# Patient Record
Sex: Male | Born: 1985 | Race: Black or African American | Hispanic: No | Marital: Single | Smoking: Current every day smoker
Health system: Southern US, Community
[De-identification: ages and names within clinical notes are randomized; demographics above are authoritative.]

## PROBLEM LIST (undated history)

## (undated) DIAGNOSIS — Q8781 Alport syndrome: Secondary | ICD-10-CM

## (undated) DIAGNOSIS — N289 Disorder of kidney and ureter, unspecified: Secondary | ICD-10-CM

## (undated) DIAGNOSIS — E876 Hypokalemia: Secondary | ICD-10-CM

---
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
## 2004-02-10 ENCOUNTER — Encounter: Admission: RE | Admit: 2004-02-10 | Discharge: 2004-02-10 | Payer: Self-pay | Admitting: Family Medicine

## 2004-03-06 ENCOUNTER — Encounter: Admission: RE | Admit: 2004-03-06 | Discharge: 2004-03-06 | Payer: Self-pay | Admitting: Family Medicine

## 2004-10-07 ENCOUNTER — Ambulatory Visit: Payer: Self-pay | Admitting: Family Medicine

## 2004-10-13 ENCOUNTER — Ambulatory Visit: Payer: Self-pay | Admitting: Family Medicine

## 2004-10-14 ENCOUNTER — Ambulatory Visit: Payer: Self-pay | Admitting: Family Medicine

## 2006-05-13 ENCOUNTER — Ambulatory Visit (HOSPITAL_COMMUNITY): Admission: RE | Admit: 2006-05-13 | Discharge: 2006-05-13 | Payer: Self-pay | Admitting: Nephrology

## 2006-12-13 ENCOUNTER — Telehealth: Payer: Self-pay | Admitting: *Deleted

## 2006-12-13 ENCOUNTER — Ambulatory Visit: Payer: Self-pay | Admitting: Family Medicine

## 2006-12-13 ENCOUNTER — Encounter (INDEPENDENT_AMBULATORY_CARE_PROVIDER_SITE_OTHER): Payer: Self-pay | Admitting: Family Medicine

## 2006-12-13 LAB — CONVERTED CEMR LAB
CO2: 24 meq/L (ref 19–32)
Chloride: 109 meq/L (ref 96–112)
Creatinine, Ser: 1.17 mg/dL (ref 0.40–1.50)
Glucose, Bld: 84 mg/dL (ref 70–99)

## 2007-10-30 ENCOUNTER — Telehealth: Payer: Self-pay | Admitting: *Deleted

## 2007-10-31 ENCOUNTER — Ambulatory Visit: Payer: Self-pay | Admitting: Family Medicine

## 2007-11-01 ENCOUNTER — Telehealth: Payer: Self-pay | Admitting: *Deleted

## 2007-11-01 ENCOUNTER — Encounter: Admission: RE | Admit: 2007-11-01 | Discharge: 2007-11-01 | Payer: Self-pay | Admitting: *Deleted

## 2007-11-02 ENCOUNTER — Telehealth (INDEPENDENT_AMBULATORY_CARE_PROVIDER_SITE_OTHER): Payer: Self-pay | Admitting: *Deleted

## 2008-05-10 ENCOUNTER — Telehealth: Payer: Self-pay | Admitting: *Deleted

## 2008-05-10 ENCOUNTER — Ambulatory Visit: Payer: Self-pay | Admitting: Family Medicine

## 2008-05-10 ENCOUNTER — Encounter: Payer: Self-pay | Admitting: Family Medicine

## 2008-05-20 ENCOUNTER — Encounter: Payer: Self-pay | Admitting: Family Medicine

## 2008-05-20 ENCOUNTER — Telehealth (INDEPENDENT_AMBULATORY_CARE_PROVIDER_SITE_OTHER): Payer: Self-pay | Admitting: *Deleted

## 2008-05-20 ENCOUNTER — Encounter (INDEPENDENT_AMBULATORY_CARE_PROVIDER_SITE_OTHER): Payer: Self-pay | Admitting: *Deleted

## 2009-06-13 ENCOUNTER — Encounter: Payer: Self-pay | Admitting: Family Medicine

## 2009-06-13 ENCOUNTER — Ambulatory Visit: Payer: Self-pay | Admitting: Family Medicine

## 2009-06-13 LAB — CONVERTED CEMR LAB: GC Probe Amp, Genital: NEGATIVE

## 2009-06-14 ENCOUNTER — Encounter (INDEPENDENT_AMBULATORY_CARE_PROVIDER_SITE_OTHER): Payer: Self-pay | Admitting: *Deleted

## 2009-06-14 DIAGNOSIS — F17200 Nicotine dependence, unspecified, uncomplicated: Secondary | ICD-10-CM | POA: Insufficient documentation

## 2009-06-16 ENCOUNTER — Encounter: Payer: Self-pay | Admitting: Family Medicine

## 2009-08-27 ENCOUNTER — Ambulatory Visit: Payer: Self-pay | Admitting: Family Medicine

## 2009-08-27 ENCOUNTER — Encounter: Payer: Self-pay | Admitting: Family Medicine

## 2010-05-01 LAB — CONVERTED CEMR LAB
Albumin: 4.9 g/dL (ref 3.5–5.2)
Alkaline Phosphatase: 55 units/L (ref 39–117)
BUN: 10 mg/dL (ref 6–23)
Calcium: 9.6 mg/dL (ref 8.4–10.5)
Creatinine, Ser: 0.92 mg/dL (ref 0.40–1.50)
Glucose, Bld: 78 mg/dL (ref 70–99)
Potassium: 3.7 meq/L (ref 3.5–5.3)

## 2010-07-20 ENCOUNTER — Ambulatory Visit: Payer: Self-pay

## 2010-07-21 ENCOUNTER — Ambulatory Visit: Payer: Self-pay | Admitting: Family Medicine

## 2010-07-21 DIAGNOSIS — J069 Acute upper respiratory infection, unspecified: Secondary | ICD-10-CM

## 2010-07-21 HISTORY — DX: Acute upper respiratory infection, unspecified: J06.9

## 2010-09-01 NOTE — Assessment & Plan Note (Signed)
Summary: flu symptoms,tcb   Vital Signs:  Patient profile:   25 year old male Weight:      141 pounds Temp:     98.9 degrees F oral Pulse rate:   69 / minute BP sitting:   120 / 80  (left arm) Cuff size:   regular  Vitals Entered By: Tessie Fass, CMA CC: cough and congestion Is Patient Diabetic? No Pain Assessment Patient in pain? yes     Location: head Intensity: 7   Primary Care Provider:  Delbert Harness MD  CC:  cough and congestion.  History of Present Illness: Mr. Heffern comes in for URI symptoms for 4 days.  C/O cnogestion, headache, body ache, cough.  Coughed so much yesterday had post-tussive emesis.  Yesterday coughed up yellow mucus but cough is non-productive today.  White nasal discharge.  Using nyquil and motrin for symptomatic relief. Helpign little.  SUbjective fevers and chills but did not take temperature.   Habits & Providers  Alcohol-Tobacco-Diet     Tobacco Status: current     Other Tobacco cigar     Other per week 2  Physical Exam  General:  mildly ill-appearing but in NAD Head:  very TTP over right maxillary sinus Eyes:  conjunctiva clear and moist, no inejction Ears:  bilateral TMs normal Nose:  mucosa edematous and pale with clear rhinorrhea Mouth:  oropharynx with postnasl drip Lungs:  clear to ausc bilaterally, normal wob Heart:  rRR without murmur Cervical Nodes:  No lymphadenopathy noted   Impression & Recommendations:  Problem # 1:  SINUSITIS, ACUTE (ICD-461.9) Assessment New  4 days of symptoms consistent with acute sinusitis.  LIkely still viral but did give Rx for antibiotic with explanation not to start unless not improving by the weekend.  Benzonatate and tussionex for cough.  REc 800 mg motrin three times a day as needed.  Advised rest and fluids.  The following medications were removed from the medication list:    Rondex-dm 12.12-03-13 Mg/51ml Syrp (Phenylephrine-chlorphen-dm) .Marland Kitchen... 2 teaspoons every 4-6 hours as needed , 150  ml His updated medication list for this problem includes:    Amoxicillin 875 Mg Tabs (Amoxicillin) .Marland Kitchen... 1 tab by mouth three times a day for sinus infection for 10 days    Benzonatate 100 Mg Caps (Benzonatate) .Marland Kitchen... 1 cap q 4 hrs as needed cough    Tussionex Pennkinetic Er 8-10 Mg/8ml Lqcr (Chlorpheniramine-hydrocodone) .Marland KitchenMarland KitchenMarland KitchenMarland Kitchen 5ml by mouth q hs as needed cough disp 50 ml  Orders: [facility]- Est  Level 4 (99214)  Complete Medication List: 1)  Amoxicillin 875 Mg Tabs (Amoxicillin) .Marland Kitchen.. 1 tab by mouth three times a day for sinus infection for 10 days 2)  Benzonatate 100 Mg Caps (Benzonatate) .Marland Kitchen.. 1 cap q 4 hrs as needed cough 3)  Tussionex Pennkinetic Er 8-10 Mg/65ml Lqcr (Chlorpheniramine-hydrocodone) .... 5ml by mouth q hs as needed cough disp 50 ml  Patient Instructions: 1)  You have a sinus infection.  THese can be caused by both viruses and bacteria.  It is usually a virus.  I have given you an antibiotic though.  If you aren't starting to feel better by the weekend or you develop a fever over 101, please go ahead and start the antibiotic.  If you start it, be sure to finish the entire course. 2)  You can use the benzonatate for cough during the day and the tussionex for cough during the night. 3)  It can take up to 2, sometimes even 3, weeks  for all teh symptoms to go away, but you should start to feel better and better after about a week.  Prescriptions: BENZONATATE 100 MG CAPS (BENZONATATE) 1 cap q 4 hrs as needed cough  #30 x 1   Entered and Authorized by:   Ardeen Garland  MD   Signed by:   Ardeen Garland  MD on 08/27/2009   Method used:   Print then Give to Patient   RxID:   [id number] AMOXICILLIN 875 MG TABS (AMOXICILLIN) 1 tab by mouth three times a day for sinus infection for 10 days  #30 x 0   Entered and Authorized by:   Ardeen Garland  MD   Signed by:   Ardeen Garland  MD on 08/27/2009   Method used:   Print then Give to Patient   RxID:   [id number] TUSSIONEX PENNKINETIC ER  8-10 MG/5ML LQCR (CHLORPHENIRAMINE-HYDROCODONE) 5mL by mouth q HS as needed cough disp 50 mL  #1 x 0   Entered and Authorized by:   Ardeen Garland  MD   Signed by:   Ardeen Garland  MD on 08/27/2009   Method used:   Print then Give to Patient   RxID:   [id number] BENZONATATE 100 MG CAPS (BENZONATATE) 1 cap q 4 hrs as needed cough  #30 x 1   Entered and Authorized by:   Ardeen Garland  MD   Signed by:   Ardeen Garland  MD on 08/27/2009   Method used:   Electronically to        [facility] [phone]* (retail)       [address]       [place], [place]  [address]       Ph: [phone]       Fax: [phone]   RxID:   [id number] AMOXICILLIN 875 MG TABS (AMOXICILLIN) 1 tab by mouth three times a day for sinus infection for 10 days  #30 x 0   Entered and Authorized by:   Ardeen Garland  MD   Signed by:   Ardeen Garland  MD on 08/27/2009   Method used:   Electronically to        [facility] [phone]* (retail)       [address]       [place], [place]  [address]       Ph: [phone]       Fax: [phone]   RxID:   [id number]

## 2010-09-01 NOTE — Letter (Signed)
Summary: Work Excuse  [facility]  [address]   [place], [place] [address]   Phone: [phone]  Fax: [phone]    Today's Date: August 27, 2009  Name of Patient: Cory Tucker  The above named patient had a medical visit today at:  11am .  Please take this into consideration when reviewing the time away from work/school.    Special Instructions:  [  ] None  [  ] To be off the remainder of today, returning to the normal work / school schedule tomorrow.  [  ] To be off until the next scheduled appointment on ______________________.  [ x ] Other  Please excuse [name] from work for Wednesday January 26th through Friday January 28th.  He can return to work as normal Monday January 31st.    Sincerely yours,   Ardeen Garland  MD

## 2010-09-03 NOTE — Assessment & Plan Note (Signed)
Summary: URI,df   Vital Signs:  Patient profile:   25 year old male Weight:      136.9 pounds Temp:     98.2 degrees F oral Pulse rate:   73 / minute BP sitting:   104 / 65  (left arm) Cuff size:   regular  Vitals Entered By: Jimmy Footman, CMA (July 21, 2010 11:28 AM) CC: cold sxs x1 month Is Patient Diabetic? No Pain Assessment Patient in pain? no        Primary Care Provider:  Delbert Harness MD  CC:  cold sxs x1 month.  History of Present Illness: 25 yo with one month of cough, fatigue.  Yellow sputum.  + headache, subjectively hot.   + sore throat.  No n/v/d, abd pain.  + rhinorrhea and sneezing.  + postansal drainage.  Overall getting better but cough remains.  Dayquil not helpful.  Snokes 2 black and milds a day for 2 years.  Habits & Providers  Alcohol-Tobacco-Diet     Tobacco Status: current  Current Medications (verified): 1)  Benzonatate 100 Mg Caps (Benzonatate) .Marland Kitchen.. 1 Cap Q 4 Hrs As Needed Cough  Social History: works at [employer] and [employer], finished Careers information officer, lives with mom  Review of Systems      See HPI  Physical Exam  General:  tired appearing, NAD Head:  Normocephalic and atraumatic without obvious abnormalities. No apparent alopecia or balding. Eyes:  No corneal or conjunctival inflammation noted. EOMI. Perrla. Ears:  External ear exam shows no significant lesions or deformities.  Otoscopic examination reveals clear canals, tympanic membranes are intact bilaterally without bulging, retraction, inflammation or discharge. Hearing is grossly normal bilaterally. Nose:  External nasal examination shows no deformity or inflammation. Nasal mucosa are pink and moist without lesions or exudates. Mouth:  Oral mucosa and oropharynx without lesions or exudates.  Teeth in good repair.  Postnasal drainage Neck:  shotty ant cervical LAD Lungs:  Normal respiratory effort, chest expands symmetrically. Lungs are clear to auscultation, no crackles or  wheezes. Heart:  Normal rate and regular rhythm. S1 and S2 normal without gallop, murmur, click, rub or other extra sounds. Abdomen:  Bowel sounds positive,abdomen soft and non-tender without masses, organomegaly or hernias noted.   Impression & Recommendations:  Problem # 1:  URI (ICD-465.9)  Residual cough from URI.  Lung exam clear, has sinusitis last year, no signs of sinusitis or other bacterial infection today.  Advised Antihistamine with decongestant and to return if does not continue to improve or starts having fevers.  Discussed toabcco cessation in setting of URI and respiratory irritant.  The following medications were removed from the medication list:    Tussionex Pennkinetic Er 8-10 Mg/71ml Lqcr (Chlorpheniramine-hydrocodone) .Marland KitchenMarland KitchenMarland KitchenMarland Kitchen 5ml by mouth q hs as needed cough disp 50 ml His updated medication list for this problem includes:    Benzonatate 100 Mg Caps (Benzonatate) .Marland Kitchen... 1 cap q 4 hrs as needed cough  Orders: [facility]- Est Level  3 (16109)  Problem # 2:  TOBACCO USER (ICD-305.1) discussed quitting.  Smoking 2 black and milds daily, restarted smoking 2 years ago.  Orders: [facility]- Est Level  3 (99213)  Complete Medication List: 1)  Benzonatate 100 Mg Caps (Benzonatate) .Marland Kitchen.. 1 cap q 4 hrs as needed cough  Patient Instructions: 1)  You can use the benzonatate if it helps for cough 2)  Try and antihistamine with a decongestant- for example- Claritin D, Allegra-D. 3)  It is not uncommon for have a cough for 4-6  weeks after you are getting better from a cold 4)  Follow-up in 2-3 weeks if you continue to have cough, or fevers.   Orders Added: 1)  [facility]- Est Level  3 [16109]

## 2010-09-14 ENCOUNTER — Encounter: Payer: Self-pay | Admitting: *Deleted

## 2011-02-10 ENCOUNTER — Emergency Department (HOSPITAL_COMMUNITY)
Admission: EM | Admit: 2011-02-10 | Discharge: 2011-02-11 | Disposition: A | Discharge status: Home / Self Care | Payer: BC Managed Care – PPO | Attending: Emergency Medicine | Admitting: Emergency Medicine

## 2011-02-10 DIAGNOSIS — IMO0001 Reserved for inherently not codable concepts without codable children: Secondary | ICD-10-CM | POA: Insufficient documentation

## 2011-02-10 DIAGNOSIS — R059 Cough, unspecified: Secondary | ICD-10-CM | POA: Insufficient documentation

## 2011-02-10 DIAGNOSIS — R05 Cough: Secondary | ICD-10-CM | POA: Insufficient documentation

## 2011-02-10 DIAGNOSIS — R509 Fever, unspecified: Secondary | ICD-10-CM | POA: Insufficient documentation

## 2011-02-10 DIAGNOSIS — B9789 Other viral agents as the cause of diseases classified elsewhere: Secondary | ICD-10-CM | POA: Insufficient documentation

## 2011-02-11 ENCOUNTER — Emergency Department (HOSPITAL_COMMUNITY): Payer: BC Managed Care – PPO

## 2011-02-11 LAB — URINALYSIS, ROUTINE W REFLEX MICROSCOPIC
Ketones, ur: NEGATIVE mg/dL
Nitrite: NEGATIVE
Protein, ur: NEGATIVE mg/dL
pH: 7 (ref 5.0–8.0)

## 2015-12-21 ENCOUNTER — Emergency Department (HOSPITAL_COMMUNITY)
Admission: EM | Admit: 2015-12-21 | Discharge: 2015-12-22 | Disposition: A | Discharge status: Home / Self Care | Payer: BLUE CROSS/BLUE SHIELD | Attending: Emergency Medicine | Admitting: Emergency Medicine

## 2015-12-21 ENCOUNTER — Emergency Department (HOSPITAL_COMMUNITY): Payer: BLUE CROSS/BLUE SHIELD

## 2015-12-21 ENCOUNTER — Encounter (HOSPITAL_COMMUNITY): Payer: Self-pay

## 2015-12-21 DIAGNOSIS — S92911A Unspecified fracture of right toe(s), initial encounter for closed fracture: Secondary | ICD-10-CM

## 2015-12-21 DIAGNOSIS — Y9389 Activity, other specified: Secondary | ICD-10-CM | POA: Diagnosis not present

## 2015-12-21 DIAGNOSIS — S99921A Unspecified injury of right foot, initial encounter: Secondary | ICD-10-CM | POA: Diagnosis present

## 2015-12-21 DIAGNOSIS — Y998 Other external cause status: Secondary | ICD-10-CM | POA: Diagnosis not present

## 2015-12-21 DIAGNOSIS — Y9289 Other specified places as the place of occurrence of the external cause: Secondary | ICD-10-CM | POA: Diagnosis not present

## 2015-12-21 DIAGNOSIS — Z87448 Personal history of other diseases of urinary system: Secondary | ICD-10-CM | POA: Insufficient documentation

## 2015-12-21 DIAGNOSIS — Q8781 Alport syndrome: Secondary | ICD-10-CM | POA: Insufficient documentation

## 2015-12-21 DIAGNOSIS — F17200 Nicotine dependence, unspecified, uncomplicated: Secondary | ICD-10-CM | POA: Insufficient documentation

## 2015-12-21 DIAGNOSIS — W1849XA Other slipping, tripping and stumbling without falling, initial encounter: Secondary | ICD-10-CM | POA: Insufficient documentation

## 2015-12-21 DIAGNOSIS — S92534A Nondisplaced fracture of distal phalanx of right lesser toe(s), initial encounter for closed fracture: Secondary | ICD-10-CM | POA: Diagnosis not present

## 2015-12-21 HISTORY — DX: Disorder of kidney and ureter, unspecified: N28.9

## 2015-12-21 HISTORY — DX: Alport syndrome: Q87.81

## 2015-12-21 MED ORDER — IBUPROFEN 600 MG PO TABS: 600.0000 mg | ORAL_TABLET | Freq: Four times a day (QID) | ORAL | Status: DC | PRN

## 2015-12-21 MED ORDER — IBUPROFEN 400 MG PO TABS
600.0000 mg | ORAL_TABLET | Freq: Once | ORAL | Status: AC
  Administered 2015-12-21: 600 mg via ORAL
  Filled 2015-12-21: qty 1

## 2015-12-21 NOTE — ED Provider Notes (Signed)
CSN: [id number]     Arrival date & time 12/21/15  2153 History   First MD Initiated Contact with Patient 12/21/15 2236     Chief Complaint  Patient presents with  . Toe Pain     (Consider location/radiation/quality/duration/timing/severity/associated sxs/prior Treatment) HPI Comments: Vision states he tripped over his child's collapsible chair and folded upon his right fifth toe yesterday.  Since that time.  It's been bruised and swollen.  Has not taken any medication for discomfort, nor tried any home remedies.  Patient is a 30 y.o. male presenting with toe pain. The history is provided by the patient.  Toe Pain This is a new problem. The current episode started yesterday. The problem occurs constantly. The problem has been gradually worsening. Associated symptoms include joint swelling. Pertinent negatives include no chills or fever. The symptoms are aggravated by walking. He has tried nothing for the symptoms. The treatment provided no relief.    Past Medical History  Diagnosis Date  . Renal disorder   . Alport syndrome    History reviewed. No pertinent past surgical history. No family history on file. Social History  Substance Use Topics  . Smoking status: Current Every Day Smoker  . Smokeless tobacco: None  . Alcohol Use: No    Review of Systems  Constitutional: Negative for fever and chills.  Respiratory: Negative for shortness of breath.   Musculoskeletal: Positive for joint swelling.  Skin: Negative for wound.  All other systems reviewed and are negative.     Allergies  Review of patient's allergies indicates not on file.  Home Medications   Prior to Admission medications   Medication Sig Start Date End Date Taking? Authorizing Provider  benzonatate (TESSALON) 100 MG capsule Take 100 mg by mouth 4 (four) times daily as needed. For cough     Historical Provider, MD  ibuprofen (ADVIL,MOTRIN) 600 MG tablet Take 1 tablet (600 mg total) by mouth every 6 (six) hours  as needed. 12/21/15   Earley FavorGail Brandonlee Navis, NP   BP 109/69 mmHg  Pulse 65  Temp(Src) 98.5 F (36.9 C) (Oral)  Resp 16  SpO2 100% Physical Exam  Constitutional: He appears well-developed and well-nourished.  HENT:  Head: Normocephalic.  Eyes: Pupils are equal, round, and reactive to light.  Neck: Normal range of motion.  Cardiovascular: Normal rate.   Pulmonary/Chest: Effort normal.  Musculoskeletal: He exhibits edema and tenderness.       Feet:  Neurological: He is alert.  Skin: Skin is warm.  Nursing note and vitals reviewed.   ED Course  Procedures (including critical care time) Labs Review Labs Reviewed - No data to display  Imaging Review Dg Foot Complete Right  12/21/2015  CLINICAL DATA:  Initial evaluation for acute traumatic injury. Pain at right fifth toe. EXAM: RIGHT FOOT COMPLETE - 3+ VIEW COMPARISON:  None available. FINDINGS: There is a subtle linear lucency through the distal phalanx of the right fifth digit, suspicious for an acute nondisplaced fracture. Mild soft tissue swelling. Associated intra-articular extension present. The right fifth DIP joint remains approximated. No other acute fracture. Osseous mineralization normal. Soft tissues otherwise unremarkable. No radiopaque foreign body. IMPRESSION: Acute nondisplaced fracture through the right fifth distal phalanx with intra-articular extension. Electronically Signed   By: Rise MuBenjamin  McClintock M.D.   On: 12/21/2015 23:22   I have personally reviewed and evaluated these images and lab results as part of my medical decision-making.   EKG Interpretation None      MDM  Displaced fracture of  the distal phalanx of the right fifth toe.  Patient has been buddy taped and placed in a flat bottom shoe, given orthopedic referral if no significant improvement in his symptoms in the next 7-10 days Final diagnoses:  Toe fracture, right, closed, initial encounter         Earley Favor, NP 12/21/15 2349  Earley Favor,  NP 12/21/15 2349  Vanetta Mulders, MD 12/23/15 [phone]

## 2015-12-21 NOTE — Discharge Instructions (Signed)
You have a nondisplaced fracture in your right fifth toe should heal without any intervention.  It is been buddy taped for support.  Please uses support method for the next 2-4 weeks.  Also been placed in a flat bottom shoe to help with ambulation.  Please take ibuprofen on a regular basis for discomfort.  You've also been given a referral to orthopedic surgery for follow-up if you do do not see any significant improvement in the next 7-10 days

## 2015-12-21 NOTE — ED Notes (Signed)
Pt reports he tripped over a chair yesterday and has pain to right foot and right pinky toe. Toe appears bruised and swollen.

## 2015-12-22 NOTE — ED Notes (Signed)
Pt is in stable condition upon d/c and ambulates from ED. 

## 2016-06-10 ENCOUNTER — Encounter (HOSPITAL_COMMUNITY): Payer: Self-pay | Admitting: Emergency Medicine

## 2016-06-10 ENCOUNTER — Ambulatory Visit (HOSPITAL_COMMUNITY)
Admission: EM | Admit: 2016-06-10 | Discharge: 2016-06-10 | Disposition: A | Discharge status: Home / Self Care | Payer: BLUE CROSS/BLUE SHIELD | Attending: Emergency Medicine | Admitting: Emergency Medicine

## 2016-06-10 DIAGNOSIS — Z202 Contact with and (suspected) exposure to infections with a predominantly sexual mode of transmission: Secondary | ICD-10-CM | POA: Diagnosis not present

## 2016-06-10 DIAGNOSIS — F17210 Nicotine dependence, cigarettes, uncomplicated: Secondary | ICD-10-CM | POA: Diagnosis not present

## 2016-06-10 MED ORDER — METRONIDAZOLE 500 MG PO TABS: 500.0000 mg | ORAL_TABLET | Freq: Two times a day (BID) | ORAL | 0 refills | Status: DC

## 2016-06-10 MED ORDER — CEFTRIAXONE SODIUM 250 MG IJ SOLR
250.0000 mg | Freq: Once | INTRAMUSCULAR | Status: AC
  Administered 2016-06-10: 250 mg via INTRAMUSCULAR

## 2016-06-10 MED ORDER — AZITHROMYCIN 250 MG PO TABS
ORAL_TABLET | ORAL | Status: AC
  Filled 2016-06-10: qty 4

## 2016-06-10 MED ORDER — AZITHROMYCIN 250 MG PO TABS
1000.0000 mg | ORAL_TABLET | Freq: Once | ORAL | Status: AC
  Administered 2016-06-10: 1000 mg via ORAL

## 2016-06-10 MED ORDER — CEFTRIAXONE SODIUM 250 MG IJ SOLR
INTRAMUSCULAR | Status: AC
  Filled 2016-06-10: qty 250

## 2016-06-10 MED ORDER — LIDOCAINE HCL (PF) 1 % IJ SOLN
INTRAMUSCULAR | Status: AC
  Filled 2016-06-10: qty 2

## 2016-06-10 NOTE — Discharge Instructions (Signed)
We went ahead and treated you for STDs today. Take the Flagyl twice a day for 7 days. Do not drink alcohol while taking this medicine. We will call you with your results in 2-3 days. Follow-up as needed.

## 2016-06-10 NOTE — ED Notes (Signed)
Pt reports he voided urine 5 min ago prior to being brought back to room.

## 2016-06-10 NOTE — ED Provider Notes (Signed)
[facility]    CSN: [id number] Arrival date & time: 06/10/16  1419     History   Chief Complaint Chief Complaint  Patient presents with  . Exposure to STD    HPI Cory Tucker is a 30 y.o. male.   HPI  He is a 30 year old man here for exposure to STD. His girlfriend recently tested positive for chlamydia and trichomonas. He denies any symptoms.  Past Medical History:  Diagnosis Date  . Alport syndrome   . Alport syndrome   . Renal disorder     Patient Active Problem List   Diagnosis Date Noted  . URI 07/21/2010  . TOBACCO USER 06/14/2009    History reviewed. No pertinent surgical history.     Home Medications    Prior to Admission medications   Medication Sig Start Date End Date Taking? Authorizing Provider  metroNIDAZOLE (FLAGYL) 500 MG tablet Take 1 tablet (500 mg total) by mouth 2 (two) times daily. 06/10/16   Charm RingsErin J Zakhi Dupre, MD    Family History No family history on file.  Social History Social History  Substance Use Topics  . Smoking status: Current Every Day Smoker    Packs/day: 0.00    Types: Cigars  . Smokeless tobacco: Never Used  . Alcohol use No     Allergies   Patient has no known allergies.   Review of Systems Review of Systems As in history of present illness  Physical Exam Triage Vital Signs ED Triage Vitals [06/10/16 1431]  Enc Vitals Group     BP 105/61     Pulse Rate 67     Resp 18     Temp 98.3 F (36.8 C)     Temp Source Oral     SpO2 98 %     Weight      Height      Head Circumference      Peak Flow      Pain Score      Pain Loc      Pain Edu?      Excl. in GC?    No data found.   Updated Vital Signs BP 105/61 (BP Location: Left Arm)   Pulse 67   Temp 98.3 F (36.8 C) (Oral)   Resp 18   SpO2 98%   Visual Acuity Right Eye Distance:   Left Eye Distance:   Bilateral Distance:    Right Eye Near:   Left Eye Near:    Bilateral Near:     Physical Exam   UC Treatments / Results    Labs (all labs ordered are listed, but only abnormal results are displayed) Labs Reviewed  CYTOLOGY, (ORAL, ANAL, URETHRAL) ANCILLARY ONLY    EKG  EKG Interpretation None       Radiology No results found.  Procedures Procedures (including critical care time)  Medications Ordered in UC Medications  azithromycin (ZITHROMAX) tablet 1,000 mg (1,000 mg Oral Given 06/10/16 1503)  cefTRIAXone (ROCEPHIN) injection 250 mg (250 mg Intramuscular Given 06/10/16 1503)     Initial Impression / Assessment and Plan / UC Course  I have reviewed the triage vital signs and the nursing notes.  Pertinent labs & imaging results that were available during my care of the patient were reviewed by me and considered in my medical decision making (see chart for details).  Clinical Course     Presumptively treated with Rocephin and azithromycin here. Prescription for Flagyl given. Follow-up as needed.  Final Clinical Impressions(s) /  UC Diagnoses   Final diagnoses:  STD exposure    New Prescriptions New Prescriptions   METRONIDAZOLE (FLAGYL) 500 MG TABLET    Take 1 tablet (500 mg total) by mouth 2 (two) times daily.     Charm RingsErin J Lea Walbert, MD 06/10/16 [phone]

## 2016-06-10 NOTE — ED Triage Notes (Signed)
Here for STD check  Reports partner went to her prenatal care and was treated for Trich and Chlamydia  Pt is asymptomatic  Sexually active in a monogamous relationship w/no condom use.   A&O x4... NAD

## 2016-06-11 LAB — CYTOLOGY, (ORAL, ANAL, URETHRAL) ANCILLARY ONLY
CHLAMYDIA, DNA PROBE: POSITIVE — AB
Neisseria Gonorrhea: NEGATIVE
TRICH (WINDOWPATH): NEGATIVE

## 2016-06-12 ENCOUNTER — Telehealth (HOSPITAL_COMMUNITY): Payer: Self-pay | Admitting: Emergency Medicine

## 2016-06-12 NOTE — Telephone Encounter (Signed)
-----   Message from Charm RingsErin J Honig, MD sent at 06/11/2016  5:07 PM EST ----- Testing is positive for Chlamydia.  [facility]  Okay to stop the Flagyl as trichomonas test is negative.  Follow up as needed. Note copied to MyChart. EH

## 2016-06-12 NOTE — Telephone Encounter (Signed)
Called pt and notified of recent lab results Pt ID'd properly... Reports feeling better and sx have subsided Adv him to stop Flagyl Adv pt if sx are not getting better to return or to f/u w/PCP Education on safe sex given Also adv pt to notify partner(s) [facility] verb understanding.

## 2016-08-11 ENCOUNTER — Emergency Department (HOSPITAL_COMMUNITY): Payer: BLUE CROSS/BLUE SHIELD

## 2016-08-11 ENCOUNTER — Encounter (HOSPITAL_COMMUNITY): Payer: Self-pay | Admitting: *Deleted

## 2016-08-11 ENCOUNTER — Emergency Department (HOSPITAL_COMMUNITY)
Admission: EM | Admit: 2016-08-11 | Discharge: 2016-08-11 | Disposition: A | Discharge status: Home / Self Care | Payer: BLUE CROSS/BLUE SHIELD | Attending: Emergency Medicine | Admitting: Emergency Medicine

## 2016-08-11 DIAGNOSIS — Z79899 Other long term (current) drug therapy: Secondary | ICD-10-CM | POA: Insufficient documentation

## 2016-08-11 DIAGNOSIS — R1084 Generalized abdominal pain: Secondary | ICD-10-CM | POA: Diagnosis not present

## 2016-08-11 DIAGNOSIS — F17290 Nicotine dependence, other tobacco product, uncomplicated: Secondary | ICD-10-CM | POA: Insufficient documentation

## 2016-08-11 DIAGNOSIS — R112 Nausea with vomiting, unspecified: Secondary | ICD-10-CM | POA: Insufficient documentation

## 2016-08-11 DIAGNOSIS — R197 Diarrhea, unspecified: Secondary | ICD-10-CM

## 2016-08-11 DIAGNOSIS — R109 Unspecified abdominal pain: Secondary | ICD-10-CM

## 2016-08-11 LAB — URINALYSIS, ROUTINE W REFLEX MICROSCOPIC
Bacteria, UA: NONE SEEN
Bilirubin Urine: NEGATIVE
Glucose, UA: NEGATIVE mg/dL
Ketones, ur: 5 mg/dL — AB
Leukocytes, UA: NEGATIVE
Nitrite: NEGATIVE
Protein, ur: 100 mg/dL — AB
Specific Gravity, Urine: 1.033 — ABNORMAL HIGH (ref 1.005–1.030)
pH: 5 (ref 5.0–8.0)

## 2016-08-11 LAB — CBC
HCT: 41.6 % (ref 39.0–52.0)
Hemoglobin: 13.8 g/dL (ref 13.0–17.0)
MCH: 28.2 pg (ref 26.0–34.0)
MCHC: 33.2 g/dL (ref 30.0–36.0)
MCV: 84.9 fL (ref 78.0–100.0)
Platelets: 120 10*3/uL — ABNORMAL LOW (ref 150–400)
RBC: 4.9 MIL/uL (ref 4.22–5.81)
RDW: 13.4 % (ref 11.5–15.5)
WBC: 4 10*3/uL (ref 4.0–10.5)

## 2016-08-11 LAB — COMPREHENSIVE METABOLIC PANEL
ALT: 21 U/L (ref 17–63)
AST: 31 U/L (ref 15–41)
Albumin: 4.2 g/dL (ref 3.5–5.0)
Alkaline Phosphatase: 44 U/L (ref 38–126)
Anion gap: 9 (ref 5–15)
BUN: 10 mg/dL (ref 6–20)
CO2: 24 mmol/L (ref 22–32)
Calcium: 9 mg/dL (ref 8.9–10.3)
Chloride: 102 mmol/L (ref 101–111)
Creatinine, Ser: 1.04 mg/dL (ref 0.61–1.24)
GFR calc Af Amer: >60 mL/min (ref >60)
GFR calc non Af Amer: >60 mL/min (ref >60)
Glucose, Bld: 101 mg/dL — ABNORMAL HIGH (ref 65–99)
Potassium: 3.6 mmol/L (ref 3.5–5.1)
Sodium: 135 mmol/L (ref 135–145)
Total Bilirubin: 0.8 mg/dL (ref 0.3–1.2)
Total Protein: 6.9 g/dL (ref 6.5–8.1)

## 2016-08-11 LAB — LIPASE, BLOOD: Lipase: 20 U/L (ref 11–51)

## 2016-08-11 MED ORDER — KETOROLAC TROMETHAMINE 15 MG/ML IJ SOLN
15.0000 mg | Freq: Once | INTRAMUSCULAR | Status: AC
  Administered 2016-08-11: 15 mg via INTRAVENOUS
  Filled 2016-08-11: qty 1

## 2016-08-11 MED ORDER — ONDANSETRON HCL 4 MG/2ML IJ SOLN
4.0000 mg | Freq: Once | INTRAMUSCULAR | Status: AC
  Administered 2016-08-11: 4 mg via INTRAVENOUS
  Filled 2016-08-11: qty 2

## 2016-08-11 MED ORDER — SODIUM CHLORIDE 0.9 % IV BOLUS (SEPSIS)
1000.0000 mL | Freq: Once | INTRAVENOUS | Status: AC
  Administered 2016-08-11: 1000 mL via INTRAVENOUS

## 2016-08-11 MED ORDER — ONDANSETRON HCL 4 MG PO TABS: 4.0000 mg | ORAL_TABLET | Freq: Four times a day (QID) | ORAL | 0 refills | Status: DC

## 2016-08-11 MED ORDER — MORPHINE SULFATE (PF) 4 MG/ML IV SOLN
4.0000 mg | Freq: Once | INTRAVENOUS | Status: AC
  Administered 2016-08-11: 4 mg via INTRAVENOUS
  Filled 2016-08-11: qty 1

## 2016-08-11 NOTE — ED Triage Notes (Signed)
Reports abdominal pain , N/V/D for 2 days. Pt reports generalized body aches as well with headache.

## 2016-08-11 NOTE — ED Provider Notes (Signed)
[facility]-EMERGENCY DEPT Provider Note    By signing my name below, I, Earmon Phoenix, attest that this documentation has been prepared under the direction and in the presence of Raeford Razor, MD. Electronically Signed: Earmon Phoenix, ED Scribe. 08/11/16. 2:03 PM.   History   Chief Complaint Chief Complaint  Patient presents with  . Abdominal Pain    The history is provided by the patient and medical records. No language interpreter was used.    Cory Tucker is a 31 y.o. male who presents to the Emergency Department complaining of moderate diffuse abdominal pain that began two days ago. He reports associated cough, sore throat, generalized body aches, nonproductive cough, nausea, vomiting and diarrhea.He reports some mild SOB. Pt reports he has not been eating and drinking normally due to the symptoms. He has taken Tylenol and DayQuil for with minimal relief. Eating or drinking induces the vomiting. Coughing increases the sore throat and body aches. Pt denies alleviating factors. He denies fever, chills.   Past Medical History:  Diagnosis Date  . Alport syndrome   . Alport syndrome   . Renal disorder     Patient Active Problem List   Diagnosis Date Noted  . URI 07/21/2010  . TOBACCO USER 06/14/2009    History reviewed. No pertinent surgical history.     Home Medications    Prior to Admission medications   Medication Sig Start Date End Date Taking? Authorizing Provider  metroNIDAZOLE (FLAGYL) 500 MG tablet Take 1 tablet (500 mg total) by mouth 2 (two) times daily. 06/10/16   Charm Rings, MD    Family History No family history on file.  Social History Social History  Substance Use Topics  . Smoking status: Current Every Day Smoker    Packs/day: 0.00    Types: Cigars  . Smokeless tobacco: Never Used  . Alcohol use No     Allergies   Patient has no known allergies.   Review of Systems Review of Systems A complete 10 system review of systems was  obtained and all systems are negative except as noted in the HPI and PMH.    Physical Exam Updated Vital Signs BP 115/64 (BP Location: Right Arm)   Pulse 74   Temp 98.6 F (37 C) (Oral)   Resp 14   SpO2 100%   Physical Exam  Constitutional: He is oriented to person, place, and time. He appears well-developed and well-nourished.  Appears tired but nontoxic.  HENT:  Head: Normocephalic.  Eyes: EOM are normal.  Neck: Normal range of motion.  Cardiovascular: Regular rhythm and normal heart sounds.  Tachycardia present.  Exam reveals no gallop and no friction rub.   No murmur heard. Mild tachycardia  Pulmonary/Chest: Effort normal and breath sounds normal. No respiratory distress. He has no wheezes. He has no rales.  Abdominal: Soft. He exhibits no distension. There is tenderness (mild diffuse).  Musculoskeletal: Normal range of motion.  Neurological: He is alert and oriented to person, place, and time.  Psychiatric: He has a normal mood and affect.  Nursing note and vitals reviewed.    ED Treatments / Results  DIAGNOSTIC STUDIES: Oxygen Saturation is 100% on 2 L/min Perla, normal by my interpretation.   COORDINATION OF CARE: 11:36 AM- Will order IV fluids and nausea medication. Pt verbalizes understanding and agrees to plan.  Medications  sodium chloride 0.9 % bolus 1,000 mL (0 mLs Intravenous Stopped 08/11/16 1257)  ondansetron (ZOFRAN) injection 4 mg (4 mg Intravenous Given 08/11/16 1211)  ketorolac (  TORADOL) 15 MG/ML injection 15 mg (15 mg Intravenous Given 08/11/16 1211)  morphine 4 MG/ML injection 4 mg (4 mg Intravenous Given 08/11/16 1211)    Labs (all labs ordered are listed, but only abnormal results are displayed) Labs Reviewed  COMPREHENSIVE METABOLIC PANEL - Abnormal; Notable for the following:       Result Value   Glucose, Bld 101 (*)    All other components within normal limits  CBC - Abnormal; Notable for the following:    Platelets 120 (*)    All other  components within normal limits  URINALYSIS, ROUTINE W REFLEX MICROSCOPIC - Abnormal; Notable for the following:    Color, Urine AMBER (*)    APPearance CLOUDY (*)    Specific Gravity, Urine 1.033 (*)    Hgb urine dipstick MODERATE (*)    Ketones, ur 5 (*)    Protein, ur 100 (*)    Squamous Epithelial / LPF 0-5 (*)    Non Squamous Epithelial 0-5 (*)    All other components within normal limits  LIPASE, BLOOD    EKG  EKG Interpretation None       Radiology No results found.  Procedures Procedures (including critical care time)  Medications Ordered in ED Medications  sodium chloride 0.9 % bolus 1,000 mL (0 mLs Intravenous Stopped 08/11/16 1257)  ondansetron (ZOFRAN) injection 4 mg (4 mg Intravenous Given 08/11/16 1211)  ketorolac (TORADOL) 15 MG/ML injection 15 mg (15 mg Intravenous Given 08/11/16 1211)  morphine 4 MG/ML injection 4 mg (4 mg Intravenous Given 08/11/16 1211)     Initial Impression / Assessment and Plan / ED Course  I have reviewed the triage vital signs and the nursing notes.  Pertinent labs & imaging results that were available during my care of the patient were reviewed by me and considered in my medical decision making (see chart for details).  Clinical Course     31 year old male with abdominal pain and nausea/vomiting/diarrhea. Suspect viral GI illness. Very low suspicion for acute surgical abdominal process or other emergent condition. It has been determined that no acute conditions requiring further emergency intervention are present at this time. The patient has been advised of the diagnosis and plan. I reviewed any labs and imaging including any potential incidental findings. We have discussed signs and symptoms that warrant return to the ED and they are listed in the discharge instructions.    I personally preformed the services scribed in my presence. The recorded information has been reviewed is accurate. Raeford RazorStephen Siegfried Vieth, MD.   Final Clinical  Impressions(s) / ED Diagnoses   Final diagnoses:  Abdominal pain, unspecified abdominal location  Nausea vomiting and diarrhea    New Prescriptions New Prescriptions   No medications on file     Raeford RazorStephen Celinda Dethlefs, MD 08/23/16 1401

## 2016-08-11 NOTE — ED Notes (Signed)
Patient transported to X-ray 

## 2016-09-10 ENCOUNTER — Encounter (HOSPITAL_COMMUNITY): Payer: Self-pay

## 2016-09-10 ENCOUNTER — Emergency Department (HOSPITAL_COMMUNITY): Payer: BLUE CROSS/BLUE SHIELD

## 2016-09-10 ENCOUNTER — Emergency Department (HOSPITAL_COMMUNITY)
Admission: EM | Admit: 2016-09-10 | Discharge: 2016-09-10 | Disposition: A | Discharge status: Home / Self Care | Payer: BLUE CROSS/BLUE SHIELD | Attending: Emergency Medicine | Admitting: Emergency Medicine

## 2016-09-10 DIAGNOSIS — F17290 Nicotine dependence, other tobacco product, uncomplicated: Secondary | ICD-10-CM | POA: Diagnosis not present

## 2016-09-10 DIAGNOSIS — J181 Lobar pneumonia, unspecified organism: Secondary | ICD-10-CM | POA: Diagnosis not present

## 2016-09-10 DIAGNOSIS — Z79899 Other long term (current) drug therapy: Secondary | ICD-10-CM | POA: Diagnosis not present

## 2016-09-10 DIAGNOSIS — E876 Hypokalemia: Secondary | ICD-10-CM | POA: Diagnosis not present

## 2016-09-10 DIAGNOSIS — J189 Pneumonia, unspecified organism: Secondary | ICD-10-CM

## 2016-09-10 DIAGNOSIS — R05 Cough: Secondary | ICD-10-CM | POA: Diagnosis present

## 2016-09-10 LAB — CBC
HEMATOCRIT: 34.6 % — AB (ref 39.0–52.0)
HEMOGLOBIN: 11.8 g/dL — AB (ref 13.0–17.0)
MCH: 28.4 pg (ref 26.0–34.0)
MCHC: 34.1 g/dL (ref 30.0–36.0)
MCV: 83.2 fL (ref 78.0–100.0)
Platelets: 176 10*3/uL (ref 150–400)
RBC: 4.16 MIL/uL — ABNORMAL LOW (ref 4.22–5.81)
RDW: 13.5 % (ref 11.5–15.5)
WBC: 11 10*3/uL — AB (ref 4.0–10.5)

## 2016-09-10 LAB — COMPREHENSIVE METABOLIC PANEL
ALBUMIN: 3.8 g/dL (ref 3.5–5.0)
ALT: 18 U/L (ref 17–63)
ANION GAP: 9 (ref 5–15)
AST: 23 U/L (ref 15–41)
Alkaline Phosphatase: 45 U/L (ref 38–126)
BILIRUBIN TOTAL: 1.2 mg/dL (ref 0.3–1.2)
BUN: 11 mg/dL (ref 6–20)
CHLORIDE: 102 mmol/L (ref 101–111)
CO2: 23 mmol/L (ref 22–32)
Calcium: 8.5 mg/dL — ABNORMAL LOW (ref 8.9–10.3)
Creatinine, Ser: 1.07 mg/dL (ref 0.61–1.24)
GFR calc Af Amer: >60 mL/min (ref >60)
GLUCOSE: 94 mg/dL (ref 65–99)
POTASSIUM: 3.1 mmol/L — AB (ref 3.5–5.1)
Sodium: 134 mmol/L — ABNORMAL LOW (ref 135–145)
TOTAL PROTEIN: 7.1 g/dL (ref 6.5–8.1)

## 2016-09-10 LAB — URINALYSIS, ROUTINE W REFLEX MICROSCOPIC
BILIRUBIN URINE: NEGATIVE
Glucose, UA: NEGATIVE mg/dL
KETONES UR: 20 mg/dL — AB
NITRITE: NEGATIVE
PROTEIN: 100 mg/dL — AB
Specific Gravity, Urine: 1.029 (ref 1.005–1.030)
pH: 5 (ref 5.0–8.0)

## 2016-09-10 LAB — CK: Total CK: 95 U/L (ref 49–397)

## 2016-09-10 LAB — I-STAT CG4 LACTIC ACID, ED: LACTIC ACID, VENOUS: 0.66 mmol/L (ref 0.5–1.9)

## 2016-09-10 LAB — LIPASE, BLOOD: LIPASE: 10 U/L — AB (ref 11–51)

## 2016-09-10 MED ORDER — BENZONATATE 100 MG PO CAPS: 100.0000 mg | ORAL_CAPSULE | Freq: Three times a day (TID) | ORAL | 0 refills | Status: DC

## 2016-09-10 MED ORDER — KETOROLAC TROMETHAMINE 30 MG/ML IJ SOLN
30.0000 mg | Freq: Once | INTRAMUSCULAR | Status: AC
  Administered 2016-09-10: 30 mg via INTRAVENOUS
  Filled 2016-09-10: qty 1

## 2016-09-10 MED ORDER — ONDANSETRON HCL 4 MG/2ML IJ SOLN
4.0000 mg | Freq: Once | INTRAMUSCULAR | Status: AC
  Administered 2016-09-10: 4 mg via INTRAVENOUS
  Filled 2016-09-10: qty 2

## 2016-09-10 MED ORDER — SODIUM CHLORIDE 0.9 % IV BOLUS (SEPSIS)
1000.0000 mL | Freq: Once | INTRAVENOUS | Status: AC
  Administered 2016-09-10: 1000 mL via INTRAVENOUS

## 2016-09-10 MED ORDER — SODIUM CHLORIDE 0.9 % IV SOLN
Freq: Once | INTRAVENOUS | Status: AC
Start: 2016-09-10 — End: 2016-09-10
  Administered 2016-09-10: 18:00:00 via INTRAVENOUS

## 2016-09-10 MED ORDER — POTASSIUM CHLORIDE ER 10 MEQ PO TBCR: 10.0000 meq | EXTENDED_RELEASE_TABLET | Freq: Two times a day (BID) | ORAL | 0 refills | Status: DC

## 2016-09-10 MED ORDER — POTASSIUM CHLORIDE CRYS ER 20 MEQ PO TBCR
40.0000 meq | EXTENDED_RELEASE_TABLET | Freq: Once | ORAL | Status: AC
  Administered 2016-09-10: 40 meq via ORAL
  Filled 2016-09-10: qty 2

## 2016-09-10 MED ORDER — IBUPROFEN 200 MG PO TABS
400.0000 mg | ORAL_TABLET | Freq: Once | ORAL | Status: AC | PRN
  Administered 2016-09-10: 400 mg via ORAL
  Filled 2016-09-10: qty 2

## 2016-09-10 MED ORDER — LEVOFLOXACIN 500 MG PO TABS: 500.0000 mg | ORAL_TABLET | Freq: Every day | ORAL | 0 refills | Status: DC

## 2016-09-10 MED ORDER — MORPHINE SULFATE (PF) 4 MG/ML IV SOLN
4.0000 mg | Freq: Once | INTRAVENOUS | Status: AC
  Administered 2016-09-10: 4 mg via INTRAVENOUS
  Filled 2016-09-10: qty 1

## 2016-09-10 MED ORDER — DEXTROSE 5 % IV SOLN
1.0000 g | Freq: Once | INTRAVENOUS | Status: AC
  Administered 2016-09-10: 1 g via INTRAVENOUS
  Filled 2016-09-10: qty 10

## 2016-09-10 MED ORDER — OXYCODONE-ACETAMINOPHEN 5-325 MG PO TABS
1.0000 | ORAL_TABLET | ORAL | Status: DC | PRN
  Administered 2016-09-10: 1 via ORAL
  Filled 2016-09-10: qty 1

## 2016-09-10 NOTE — ED Provider Notes (Signed)
[facility]-EMERGENCY DEPT Provider Note   CSN: [id number] Arrival date & time: 09/10/16  1459     History   Chief Complaint Chief Complaint  Patient presents with  . Flank Pain    HPI Cory Tucker is a 31 y.o. male. Chief complaint fever and body aches  HPI:  Patient states that he had a illness "a few weeks ago" and was seen at [facility] and discharged. He had a weak feeling better and returned.  He reports symptoms starting 3 days ago on Tuesday. States that he felt weak and had to leave work early. He said fevers and body aches since then with cough. Been taking over-the-counter medications with minimal improvement. Has diffuse bodyaches now. Nausea but no vomiting. Cough. Dark urine. No diarrhea. Did not receive a flu vaccine.  History of Alport syndrome. States he used to have hematuria but has not for a long time. His urine is been dark for the last all days. No history of liver disease.   Past Medical History:  Diagnosis Date  . Alport syndrome   . Alport syndrome   . Renal disorder     Patient Active Problem List   Diagnosis Date Noted  . URI 07/21/2010  . TOBACCO USER 06/14/2009    History reviewed. No pertinent surgical history.     Home Medications    Prior to Admission medications   Medication Sig Start Date End Date Taking? Authorizing Provider  acetaminophen (TYLENOL) 325 MG tablet Take 650 mg by mouth every 6 (six) hours as needed for mild pain.   Yes Historical Provider, MD  ibuprofen (ADVIL,MOTRIN) 200 MG tablet Take 600-800 mg by mouth every 6 (six) hours as needed for moderate pain.   Yes Historical Provider, MD  Pseudoeph-Doxylamine-DM-APAP (NYQUIL PO) Take 1 capsule by mouth every 8 (eight) hours as needed (cold).   Yes Historical Provider, MD  benzonatate (TESSALON) 100 MG capsule Take 1 capsule (100 mg total) by mouth every 8 (eight) hours. 09/10/16   Rolland Porter, MD  levofloxacin (LEVAQUIN) 500 MG tablet Take 1 tablet (500 mg total) by mouth  daily. 09/10/16   Rolland Porter, MD  ondansetron (ZOFRAN) 4 MG tablet Take 1 tablet (4 mg total) by mouth every 6 (six) hours. Patient not taking: Reported on 09/10/2016 08/11/16   Raeford Razor, MD  potassium chloride (K-DUR) 10 MEQ tablet Take 1 tablet (10 mEq total) by mouth 2 (two) times daily. 09/10/16   Rolland Porter, MD    Family History History reviewed. No pertinent family history.  Social History Social History  Substance Use Topics  . Smoking status: Current Every Day Smoker    Packs/day: 0.00    Types: Cigars  . Smokeless tobacco: Never Used  . Alcohol use No     Allergies   Patient has no known allergies.   Review of Systems Review of Systems  Constitutional: Positive for appetite change, chills, diaphoresis, fatigue and fever.  HENT: Positive for sneezing and sore throat. Negative for mouth sores and trouble swallowing.   Eyes: Negative for visual disturbance.  Respiratory: Positive for cough and shortness of breath. Negative for chest tightness and wheezing.   Cardiovascular: Negative for chest pain.  Gastrointestinal: Positive for nausea. Negative for abdominal distention, abdominal pain, diarrhea and vomiting.  Endocrine: Negative for polydipsia, polyphagia and polyuria.  Genitourinary: Negative for dysuria, frequency and hematuria.  Musculoskeletal: Positive for myalgias. Negative for gait problem.  Skin: Negative for color change, pallor and rash.  Neurological: Negative for dizziness,  syncope, light-headedness and headaches.  Hematological: Does not bruise/bleed easily.  Psychiatric/Behavioral: Negative for behavioral problems and confusion.     Physical Exam Updated Vital Signs BP 101/62   Pulse 75   Temp 99.6 F (37.6 C) (Oral)   Resp 14   Ht 6' (1.829 m)   Wt 136 lb (61.7 kg)   SpO2 96%   BMI 18.44 kg/m   Physical Exam  Constitutional: He is oriented to person, place, and time. He appears well-developed and well-nourished. No distress.  31 year old  male. 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 Laying underneath a blanket.  HENT:  Head: Normocephalic.  Conjunctiva not pale. Sclera anicteric. There is normal. No exudate.  Eyes: Conjunctivae are normal. Pupils are equal, round, and reactive to light. No scleral icterus.  Neck: Normal range of motion. Neck supple. No thyromegaly present.  Supple. No adenopathy.  Cardiovascular: Normal rate and regular rhythm.  Exam reveals no gallop and no friction rub.   No murmur heard. Pulmonary/Chest: Effort normal and breath sounds normal. No respiratory distress. He has no wheezes. He has no rales.  Clear bilateral breath sounds. No increased worker breathing.  Abdominal: Soft. Bowel sounds are normal. He exhibits no distension. There is no tenderness. There is no rebound.  Soft abdomen. No upper quadrant tenderness. Nontender over the liver. No hepatosplenomegaly clinically.  Musculoskeletal: Normal range of motion.  diffuse muscle aches chest back arms legs.  Neurological: He is alert and oriented to person, place, and time.  Awake alert and mentating well.  Skin: Skin is warm and dry. No rash noted.  No rash.  Psychiatric: He has a normal mood and affect. His behavior is normal.     ED Treatments / Results  Labs (all labs ordered are listed, but only abnormal results are displayed) Labs Reviewed  LIPASE, BLOOD - Abnormal; Notable for the following:       Result Value   Lipase 10 (*)    All other components within normal limits  COMPREHENSIVE METABOLIC PANEL - Abnormal; Notable for the following:    Sodium 134 (*)    Potassium 3.1 (*)    Calcium 8.5 (*)    All other components within normal limits  CBC - Abnormal; Notable for the following:    WBC 11.0 (*)    RBC 4.16 (*)    Hemoglobin 11.8 (*)    HCT 34.6 (*)    All other components within normal limits  URINALYSIS, ROUTINE W REFLEX MICROSCOPIC - Abnormal; Notable for the following:    Color, Urine AMBER (*)    APPearance HAZY (*)    Hgb urine  dipstick LARGE (*)    Ketones, ur 20 (*)    Protein, ur 100 (*)    Leukocytes, UA SMALL (*)    Bacteria, UA FEW (*)    Squamous Epithelial / LPF 0-5 (*)    All other components within normal limits  CK  I-STAT CG4 LACTIC ACID, ED    EKG  EKG Interpretation None       Radiology Dg Chest 2 View  Result Date: 09/10/2016 CLINICAL DATA:  Cough and fever. EXAM: CHEST  2 VIEW COMPARISON:  None. FINDINGS: Heart size is normal. Left lower lobe pneumonia is present. Right lung is clear. The visualized soft tissues and bony thorax are unremarkable. IMPRESSION: Left lower lobe pneumonia. Electronically Signed   By: Marin Robertshristopher  Mattern M.D.   On: 09/10/2016 17:19    Procedures Procedures (including critical care time)  Medications Ordered in ED Medications  oxyCODONE-acetaminophen (PERCOCET/ROXICET) 5-325 MG per tablet 1 tablet (1 tablet Oral Given 09/10/16 1519)  cefTRIAXone (ROCEPHIN) 1 g in dextrose 5 % 50 mL IVPB (not administered)  ketorolac (TORADOL) 30 MG/ML injection 30 mg (not administered)  potassium chloride SA (K-DUR,KLOR-CON) CR tablet 40 mEq (not administered)  ibuprofen (ADVIL,MOTRIN) tablet 400 mg (400 mg Oral Given 09/10/16 1519)  sodium chloride 0.9 % bolus 1,000 mL (0 mLs Intravenous Stopped 09/10/16 1729)  0.9 %  sodium chloride infusion ( Intravenous New Bag/Given 09/10/16 1730)  ondansetron (ZOFRAN) injection 4 mg (4 mg Intravenous Given 09/10/16 1729)  morphine 4 MG/ML injection 4 mg (4 mg Intravenous Given 09/10/16 1729)     Initial Impression / Assessment and Plan / ED Course  I have reviewed the triage vital signs and the nursing notes.  Pertinent labs & imaging results that were available during my care of the patient were reviewed by me and considered in my medical decision making (see chart for details).     Febrile illness. Urine appears dark. May be blood. We will obtain urinalysis. Liver enzymes. He is not jaundiced or icteric. Is not a rapid quadrant pain. CPK.  Fluid hydration symptom control. Chest x-ray. Reevaluation. May be viral syndrome. Rule out rhabdomyolysis with muscle pain and dark urine. Rule out liver abnormality with dark urine. May be exacerbation of hematuria with concentrated urine and Alport syndrome with blood.  Final Clinical Impressions(s) / ED Diagnoses   Final diagnoses:  Community acquired pneumonia of left lower lobe of lung (HCC)  Hypokalemia   Minimal hypokalemia. Chest x-ray shows left lower lobe infiltrate. Patient is tolerating this well. After the above he is feeling much improved. Liver and kidney function are baseline. Has hematuria consistent with his Alport syndrome but normal creatinine. Normal hepato-biliary enzymes. Given IV Rocephin. Given by mouth potassium. He is appropriate for discharge home. Plan Tessalon, Theron Arista, Levaquin. Push fluids stay hydrated primary care follow-up. ER changes or worsening.  New Prescriptions New Prescriptions   BENZONATATE (TESSALON) 100 MG CAPSULE    Take 1 capsule (100 mg total) by mouth every 8 (eight) hours.   LEVOFLOXACIN (LEVAQUIN) 500 MG TABLET    Take 1 tablet (500 mg total) by mouth daily.   POTASSIUM CHLORIDE (K-DUR) 10 MEQ TABLET    Take 1 tablet (10 mEq total) by mouth 2 (two) times daily.     Rolland Porter, MD 09/10/16 [phone]

## 2016-09-10 NOTE — ED Triage Notes (Signed)
PT C/O BILATRAL FLANK PAIN AND FEVER SINCE Wednesday. 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 PT STS HE HAS BEEN TAKING OTC MEDICATIONS SINCE Wednesday WITH NO RELIEF.

## 2016-09-10 NOTE — ED Notes (Signed)
Pt here with flu like symptoms with body aches and fever.  Urine appears concentrated.  EDP has seen pt.  Pt feels warm to touch

## 2016-09-10 NOTE — Discharge Instructions (Signed)
Motrin and/or Tylenol for fever. Push fluids to stay hydrated. Potassium was slightly low today. You have been given a prescription for potassium 3 times per day for the next 7 days.

## 2017-07-20 ENCOUNTER — Emergency Department (HOSPITAL_COMMUNITY): Payer: BLUE CROSS/BLUE SHIELD

## 2017-07-20 ENCOUNTER — Encounter (HOSPITAL_COMMUNITY): Payer: Self-pay | Admitting: Emergency Medicine

## 2017-07-20 ENCOUNTER — Other Ambulatory Visit: Payer: Self-pay

## 2017-07-20 ENCOUNTER — Emergency Department (HOSPITAL_COMMUNITY)
Admission: EM | Admit: 2017-07-20 | Discharge: 2017-07-20 | Disposition: A | Discharge status: Home / Self Care | Payer: BLUE CROSS/BLUE SHIELD | Attending: Emergency Medicine | Admitting: Emergency Medicine

## 2017-07-20 DIAGNOSIS — J181 Lobar pneumonia, unspecified organism: Secondary | ICD-10-CM | POA: Diagnosis not present

## 2017-07-20 DIAGNOSIS — J189 Pneumonia, unspecified organism: Secondary | ICD-10-CM

## 2017-07-20 DIAGNOSIS — R05 Cough: Secondary | ICD-10-CM | POA: Diagnosis present

## 2017-07-20 DIAGNOSIS — F17290 Nicotine dependence, other tobacco product, uncomplicated: Secondary | ICD-10-CM | POA: Diagnosis not present

## 2017-07-20 DIAGNOSIS — E876 Hypokalemia: Secondary | ICD-10-CM | POA: Diagnosis not present

## 2017-07-20 HISTORY — DX: Hypokalemia: E87.6

## 2017-07-20 LAB — URINALYSIS, ROUTINE W REFLEX MICROSCOPIC
Bilirubin Urine: NEGATIVE
Glucose, UA: NEGATIVE mg/dL
Hgb urine dipstick: NEGATIVE
Ketones, ur: 5 mg/dL — AB
Leukocytes, UA: NEGATIVE
Nitrite: NEGATIVE
Protein, ur: NEGATIVE mg/dL
Specific Gravity, Urine: 1.015 (ref 1.005–1.030)
pH: 8 (ref 5.0–8.0)

## 2017-07-20 LAB — BASIC METABOLIC PANEL
Anion gap: 9 (ref 5–15)
BUN: 11 mg/dL (ref 6–20)
CO2: 21 mmol/L — ABNORMAL LOW (ref 22–32)
Calcium: 8.9 mg/dL (ref 8.9–10.3)
Chloride: 104 mmol/L (ref 101–111)
Creatinine, Ser: 0.91 mg/dL (ref 0.61–1.24)
GFR calc Af Amer: >60 mL/min (ref >60)
GFR calc non Af Amer: >60 mL/min (ref >60)
Glucose, Bld: 108 mg/dL — ABNORMAL HIGH (ref 65–99)
Potassium: 3.2 mmol/L — ABNORMAL LOW (ref 3.5–5.1)
Sodium: 134 mmol/L — ABNORMAL LOW (ref 135–145)

## 2017-07-20 LAB — CBC WITH DIFFERENTIAL/PLATELET
Basophils Absolute: 0 10*3/uL (ref 0.0–0.1)
Basophils Relative: 0 %
Eosinophils Absolute: 0 10*3/uL (ref 0.0–0.7)
Eosinophils Relative: 0 %
HCT: 37.6 % — ABNORMAL LOW (ref 39.0–52.0)
Hemoglobin: 12.6 g/dL — ABNORMAL LOW (ref 13.0–17.0)
Lymphocytes Relative: 14 %
Lymphs Abs: 1.6 10*3/uL (ref 0.7–4.0)
MCH: 28.3 pg (ref 26.0–34.0)
MCHC: 33.5 g/dL (ref 30.0–36.0)
MCV: 84.5 fL (ref 78.0–100.0)
Monocytes Absolute: 0.9 10*3/uL (ref 0.1–1.0)
Monocytes Relative: 8 %
Neutro Abs: 9.5 10*3/uL — ABNORMAL HIGH (ref 1.7–7.7)
Neutrophils Relative %: 78 %
Platelets: 200 10*3/uL (ref 150–400)
RBC: 4.45 MIL/uL (ref 4.22–5.81)
RDW: 13.5 % (ref 11.5–15.5)
WBC: 12 10*3/uL — ABNORMAL HIGH (ref 4.0–10.5)

## 2017-07-20 LAB — I-STAT CG4 LACTIC ACID, ED: LACTIC ACID, VENOUS: 2.13 mmol/L — AB (ref 0.5–1.9)

## 2017-07-20 MED ORDER — ACETAMINOPHEN 325 MG PO TABS
650.0000 mg | ORAL_TABLET | Freq: Once | ORAL | Status: AC
  Administered 2017-07-20: 650 mg via ORAL
  Filled 2017-07-20: qty 2

## 2017-07-20 MED ORDER — ONDANSETRON HCL 4 MG/2ML IJ SOLN
4.0000 mg | Freq: Once | INTRAMUSCULAR | Status: AC
  Administered 2017-07-20: 4 mg via INTRAVENOUS
  Filled 2017-07-20: qty 2

## 2017-07-20 MED ORDER — SODIUM CHLORIDE 0.9 % IV BOLUS (SEPSIS)
1000.0000 mL | Freq: Once | INTRAVENOUS | Status: AC
  Administered 2017-07-20: 1000 mL via INTRAVENOUS

## 2017-07-20 MED ORDER — AZITHROMYCIN 250 MG PO TABS: ORAL_TABLET | ORAL | 0 refills | Status: DC

## 2017-07-20 MED ORDER — BENZONATATE 100 MG PO CAPS: 100.0000 mg | ORAL_CAPSULE | Freq: Three times a day (TID) | ORAL | 0 refills | Status: DC | PRN

## 2017-07-20 MED ORDER — DEXTROSE 5 % IV SOLN
1.0000 g | Freq: Once | INTRAVENOUS | Status: AC
  Administered 2017-07-20: 1 g via INTRAVENOUS
  Filled 2017-07-20: qty 10

## 2017-07-20 MED ORDER — POTASSIUM CHLORIDE CRYS ER 20 MEQ PO TBCR
40.0000 meq | EXTENDED_RELEASE_TABLET | Freq: Once | ORAL | Status: AC
  Administered 2017-07-20: 40 meq via ORAL
  Filled 2017-07-20: qty 2

## 2017-07-20 MED ORDER — DOXYCYCLINE HYCLATE 100 MG PO CAPS: 100.0000 mg | ORAL_CAPSULE | Freq: Two times a day (BID) | ORAL | 0 refills | Status: AC

## 2017-07-20 MED ORDER — POTASSIUM CHLORIDE ER 10 MEQ PO TBCR: 10.0000 meq | EXTENDED_RELEASE_TABLET | Freq: Every day | ORAL | 0 refills | Status: DC

## 2017-07-20 MED ORDER — AZITHROMYCIN 250 MG PO TABS
500.0000 mg | ORAL_TABLET | Freq: Once | ORAL | Status: AC
  Administered 2017-07-20: 500 mg via ORAL
  Filled 2017-07-20: qty 2

## 2017-07-20 MED ORDER — DEXTROSE 5 % IV SOLN
500.0000 mg | Freq: Once | INTRAVENOUS | Status: DC
  Filled 2017-07-20: qty 500

## 2017-07-20 NOTE — ED Provider Notes (Signed)
[facility]-EMERGENCY DEPT Provider Note   CSN: [id number] Arrival date & time: 07/20/17  1929     History   Chief Complaint Chief Complaint  Patient presents with  . Fever  . Weakness  . Generalized Body Aches    HPI Cory Tucker is a 31 y.o. 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  Patient states cough is productive of yellow sputum intermittently.  He also notes subjective fevers and chills.  Today he developed acute onset of generalized myalgias and fatigue.  Denies syncope but endorses lightheadedness.  States that he has not been eating and drinking as much as usual.  Has not produced urine today.  Denies shortness of breath.  Endorses chest soreness anteriorly and posteriorly with cough only.  Endorses nausea but no vomiting.  Denies diarrhea or constipation.  He also endorses nasal congestion and sinus pressure as well as sore throat.  Denies facial swelling, drooling, or throat tightness.  Has not tried anything for his symptoms.  Mother states that he works 2 jobs and "gets like this around this time of year.  Last time it was a pneumonia and his potassium was low ".  Mother notes that his 2 children at home have similar symptoms but not as severe.  No recent travel.  The history is provided by the patient and a parent.  Weakness  Associated symptoms include chest pain (with cough only). Pertinent negatives include no shortness of breath and no vomiting.    Past Medical History:  Diagnosis Date  . Alport syndrome   . Alport syndrome   . Hypokalemia   . Renal disorder     Patient Active Problem List   Diagnosis Date Noted  . URI 07/21/2010  . TOBACCO USER 06/14/2009    History reviewed. No pertinent surgical history.     Home Medications    Prior to Admission medications   Medication Sig Start Date End Date Taking? Authorizing Provider    acetaminophen (TYLENOL) 325 MG tablet Take 650 mg by mouth every 6 (six) hours as needed for mild pain.    [provider]  azithromycin (ZITHROMAX) 250 MG tablet Take 1 tablet daily beginning tomorrow (07/21/2017) for 4 days 07/20/17   Michela Pitcher A, PA-C  benzonatate (TESSALON) 100 MG capsule Take 1 capsule (100 mg total) by mouth 3 (three) times daily as needed for cough. 07/20/17   Luevenia Maxin, Kolina Kube A, PA-C  doxycycline (VIBRAMYCIN) 100 MG capsule Take 1 capsule (100 mg total) by mouth 2 (two) times daily for 10 days. 07/20/17 07/30/17  Michela Pitcher A, PA-C  ibuprofen (ADVIL,MOTRIN) 200 MG tablet Take 600-800 mg by mouth every 6 (six) hours as needed for moderate pain.    [provider]  ondansetron (ZOFRAN) 4 MG tablet Take 1 tablet (4 mg total) by mouth every 6 (six) hours. Patient not taking: Reported on 09/10/2016 08/11/16   Raeford Razor, MD  potassium chloride (K-DUR) 10 MEQ tablet Take 1 tablet (10 mEq total) by mouth daily for 5 days. 07/20/17 07/25/17  Michela Pitcher A, PA-C  Pseudoeph-Doxylamine-DM-APAP (NYQUIL PO) Take 1 capsule by mouth every 8 (eight) hours as needed (cold).    [provider]    Family History History reviewed. No pertinent family history.  Social History Social History   Tobacco Use  . Smoking status: Current Every Day Smoker    Packs/day: 0.00    Types: Cigars  .  Smokeless tobacco: Never Used  Substance Use Topics  . Alcohol use: No  . Drug use: No     Allergies   Patient has no known allergies.   Review of Systems Review of Systems  Constitutional: Positive for chills, fatigue and fever.  HENT: Positive for congestion and sore throat. Negative for drooling and facial swelling.   Respiratory: Positive for cough. Negative for shortness of breath.   Cardiovascular: Positive for chest pain (with cough only).  Gastrointestinal: Positive for nausea. Negative for abdominal pain and vomiting.  Genitourinary: Positive for decreased  urine volume. Negative for dysuria, frequency, hematuria and urgency.  Musculoskeletal: Positive for back pain and myalgias. Negative for neck pain.  Neurological: Positive for weakness and light-headedness. Negative for syncope.  All other systems reviewed and are negative.    Physical Exam Updated Vital Signs BP (!) 106/59 (BP Location: Right Arm)   Pulse 60   Temp 98.4 F (36.9 C) (Oral)   Resp 16   SpO2 97%   Physical Exam  Constitutional: He is oriented to person, place, and time. He appears well-developed and well-nourished. No distress.  HENT:  Head: Normocephalic and atraumatic.  Right Ear: External ear normal.  Left Ear: External ear normal.  Eyes: Conjunctivae are normal. Right eye exhibits no discharge. Left eye exhibits no discharge.  Neck: Normal range of motion. Neck supple. No JVD present. No tracheal deviation present.  Cardiovascular: Normal rate, regular rhythm, normal heart sounds and intact distal pulses.  Pulmonary/Chest: Effort normal and breath sounds normal. No stridor. No respiratory distress. He has no wheezes. He has no rales. He exhibits tenderness.  Abdominal: Soft. Bowel sounds are normal. He exhibits no distension. There is no tenderness.  Musculoskeletal: Normal range of motion. He exhibits no edema.  Diffuse tenderness to palpation of the paraspinal musculature and extremities with no swelling, deformity,  crepitus, or erythema.  Moves extremities spontaneously without difficulty  Neurological: He is alert and oriented to person, place, and time. No cranial nerve deficit.  Alert, but prefers to keep eyes closed.  Answers questions appropriately and follows commands without difficulty.  Appears sleepy but easily arousable.  Repeats multiple times "I am just really tired and everything hurts ".  Skin: Skin is warm and dry. No erythema.  Psychiatric: He has a normal mood and affect. His behavior is normal.  Nursing note and vitals reviewed.    ED  Treatments / Results  Labs (all labs ordered are listed, but only abnormal results are displayed) Labs Reviewed  BASIC METABOLIC PANEL - Abnormal; Notable for the following components:      Result Value   Sodium 134 (*)    Potassium 3.2 (*)    CO2 21 (*)    Glucose, Bld 108 (*)    All other components within normal limits  CBC WITH DIFFERENTIAL/PLATELET - Abnormal; Notable for the following components:   WBC 12.0 (*)    Hemoglobin 12.6 (*)    HCT 37.6 (*)    Neutro Abs 9.5 (*)    All other components within normal limits  URINALYSIS, ROUTINE W REFLEX MICROSCOPIC - Abnormal; Notable for the following components:   Ketones, ur 5 (*)    All other components within normal limits  I-STAT CG4 LACTIC ACID, ED - Abnormal; Notable for the following components:   Lactic Acid, Venous 2.13 (*)    All other components within normal limits    EKG  EKG Interpretation None       Radiology Dg  Chest 2 View  Result Date: 07/20/2017 CLINICAL DATA:  Fever and cough EXAM: CHEST  2 VIEW COMPARISON:  09/10/2016 FINDINGS: Patchy pneumonia at the left lung base. No pleural effusion. Normal heart size. No pneumothorax. IMPRESSION: Patchy left lower lobe pneumonia Electronically Signed   By: Jasmine PangKim  Fujinaga M.D.   On: 07/20/2017 21:03    Procedures Procedures (including critical care time)  Medications Ordered in ED Medications  sodium chloride 0.9 % bolus 1,000 mL (0 mLs Intravenous Stopped 07/20/17 2139)  ondansetron (ZOFRAN) injection 4 mg (4 mg Intravenous Given 07/20/17 2047)  sodium chloride 0.9 % bolus 1,000 mL (0 mLs Intravenous Stopped 07/20/17 2309)  cefTRIAXone (ROCEPHIN) 1 g in dextrose 5 % 50 mL IVPB (0 g Intravenous Stopped 07/20/17 2309)  potassium chloride SA (K-DUR,KLOR-CON) CR tablet 40 mEq (40 mEq Oral Given 07/20/17 2311)  azithromycin (ZITHROMAX) tablet 500 mg (500 mg Oral Given 07/20/17 2310)  acetaminophen (TYLENOL) tablet 650 mg (650 mg Oral Given 07/20/17 2309)      Initial Impression / Assessment and Plan / ED Course  I have reviewed the triage vital signs and the nursing notes.  Pertinent labs & imaging results that were available during my care of the patient were reviewed by me and considered in my medical decision making (see chart for details).     Patient with generalized myalgias and productive cough for 1 week.  Fevers and chills at home.  Afebrile in the ED and vital signs are stable.  Chest x-ray shows patchy left lower lobe pneumonia.  Lab work shows slight leukocytosis of 12, slight hypokalemia replenished while in the ED. UA is not concerning for UTI.  Found to have a elevated lactate of 2.13, initiated fluid resuscitation and Rocephin and azithromycin.  Doubt ACS or MI in an otherwise young healthy patient with no cardiac risk factors.  Doubt PE in a low risk patient with no shortness of breath, chest pain, or tachycardia.  On reevaluation, patient is resting comfortably in no apparent distress.  He is stable for discharge home with follow-up with primary care physician.  Will discharge with doxycycline and a azithromycin, Tessalon for cough, and potassium for the next several days.  Discussed indications for return to the ED.  Patient's mother verbalized understanding of this time.  Final Clinical Impressions(s) / ED Diagnoses   Final diagnoses:  Community acquired pneumonia of left lower lobe of lung (HCC)  Hypokalemia    ED Discharge Orders        Ordered    doxycycline (VIBRAMYCIN) 100 MG capsule  2 times daily     07/20/17 2251    azithromycin (ZITHROMAX) 250 MG tablet     07/20/17 2251    benzonatate (TESSALON) 100 MG capsule  3 times daily PRN     07/20/17 2251    potassium chloride (K-DUR) 10 MEQ tablet  Daily     07/20/17 2251       Jeanie SewerFawze, Mearl Olver A, PA-C 07/21/17 0047    Shaune PollackIsaacs, Cameron, MD 07/21/17 1215

## 2017-07-20 NOTE — ED Triage Notes (Signed)
Patient BIB [facility] from home. Pt sick x2 days fever started to day. Pt experiencing aching, chills, white sputum. Increased weakness.  Pt was able to go to work this am but since then, unable to walk down stairs of home for EMS. Hx of pneumonia and hypokalemia, EMS reports lungs clear at this time. Difficult to rouse, alert to voice, oriented x4 when prompted.

## 2017-07-20 NOTE — ED Notes (Signed)
Patient is "too tired" to change into gown at this time and does not know his height and weight.

## 2017-07-20 NOTE — ED Notes (Signed)
Bed: [id number] Expected date:  Expected time:  Means of arrival:  Comments: 31 yo M  Fever

## 2017-07-20 NOTE — Discharge Instructions (Signed)
Please take all of your antibiotics until finished!   You may develop abdominal discomfort or diarrhea from the antibiotic.  You may help offset this with probiotics which you can buy or get in yogurt. Do not eat  or take the probiotics until 2 hours after your antibiotic.   Take Tessalon for cough.  Take potassium for the next several days.  Drink plenty of fluids and get plenty of rest.  Use over-the-counter decongestants and allergy medications for your nasal congestion.  Follow-up with primary care physician for reevaluation of symptoms.  Alternate 600 mg of ibuprofen and [phone] mg of Tylenol every 3 hours as needed for pain and fever for the next 3-4 days. Do not exceed 4000 mg of Tylenol daily.  Return to the ED immediately for any concerning signs or symptoms develop such as persisting shortness of breath, fever that is not controlled by ibuprofen or Tylenol, or persisting chest pain.

## 2018-09-14 ENCOUNTER — Other Ambulatory Visit: Payer: Self-pay

## 2018-09-14 ENCOUNTER — Emergency Department (HOSPITAL_COMMUNITY)
Admission: EM | Admit: 2018-09-14 | Discharge: 2018-09-14 | Disposition: A | Discharge status: Home / Self Care | Payer: BLUE CROSS/BLUE SHIELD | Attending: Emergency Medicine | Admitting: Emergency Medicine

## 2018-09-14 ENCOUNTER — Emergency Department (HOSPITAL_COMMUNITY): Payer: BLUE CROSS/BLUE SHIELD

## 2018-09-14 ENCOUNTER — Encounter (HOSPITAL_COMMUNITY): Payer: Self-pay

## 2018-09-14 DIAGNOSIS — F17210 Nicotine dependence, cigarettes, uncomplicated: Secondary | ICD-10-CM | POA: Diagnosis not present

## 2018-09-14 DIAGNOSIS — Z79899 Other long term (current) drug therapy: Secondary | ICD-10-CM | POA: Insufficient documentation

## 2018-09-14 DIAGNOSIS — L03031 Cellulitis of right toe: Secondary | ICD-10-CM | POA: Insufficient documentation

## 2018-09-14 LAB — BASIC METABOLIC PANEL
Anion gap: 12 (ref 5–15)
BUN: 12 mg/dL (ref 6–20)
CO2: 24 mmol/L (ref 22–32)
Calcium: 9.1 mg/dL (ref 8.9–10.3)
Chloride: 103 mmol/L (ref 98–111)
Creatinine, Ser: 0.97 mg/dL (ref 0.61–1.24)
GFR calc Af Amer: >60 mL/min (ref >60)
GFR calc non Af Amer: >60 mL/min (ref >60)
Glucose, Bld: 92 mg/dL (ref 70–99)
Potassium: 4.1 mmol/L (ref 3.5–5.1)
Sodium: 139 mmol/L (ref 135–145)

## 2018-09-14 MED ORDER — SULFAMETHOXAZOLE-TRIMETHOPRIM 800-160 MG PO TABS: 1.0000 | ORAL_TABLET | Freq: Two times a day (BID) | ORAL | 0 refills | Status: AC

## 2018-09-14 MED ORDER — AMOXICILLIN-POT CLAVULANATE 875-125 MG PO TABS: 1.0000 | ORAL_TABLET | Freq: Two times a day (BID) | ORAL | 0 refills | Status: DC

## 2018-09-14 MED ORDER — ACETAMINOPHEN 325 MG PO TABS
650.0000 mg | ORAL_TABLET | Freq: Once | ORAL | Status: AC
  Administered 2018-09-14: 650 mg via ORAL
  Filled 2018-09-14: qty 2

## 2018-09-14 NOTE — Discharge Instructions (Addendum)
Please read attached information. If you experience any new or worsening signs or symptoms please return to the emergency room for evaluation. Please follow-up with your primary care provider or specialist as discussed. Please use medication prescribed only as directed and discontinue taking if you have any concerning signs or symptoms.   °

## 2018-09-14 NOTE — ED Triage Notes (Signed)
Pt reports right 4th toe began hurting Sunday. Swelling and redness noted. Some bleeding noted to the inside. Denies hx of DM. Pt ambulatory with a limp.

## 2018-09-14 NOTE — ED Provider Notes (Signed)
[facility] EMERGENCY DEPARTMENT Provider Note   CSN: [id number] Arrival date & time: 09/14/18  0549    History   Chief Complaint Chief Complaint  Patient presents with  . Toe Pain    HPI Cory Tucker is a 33 y.o. male.  HPI   33 year old male presents today with complaints of foot pain.  Patient notes 4 days ago he noticed some slight discomfort in between his right third and fourth toes.  He notes he has been able to continue working.  He notes yesterday he had bleeding from the toe and noticed swelling in the foot.  Patient denies any swelling to the dorsum of the foot, ankle or calf.  He denies any fever or chills.  No history of diabetes.  No medications prior to arrival.    Past Medical History:  Diagnosis Date  . Alport syndrome   . Alport syndrome   . Hypokalemia   . Renal disorder     Patient Active Problem List   Diagnosis Date Noted  . URI 07/21/2010  . TOBACCO USER 06/14/2009    History reviewed. No pertinent surgical history.      Home Medications    Prior to Admission medications   Medication Sig Start Date End Date Taking? Authorizing Provider  acetaminophen (TYLENOL) 325 MG tablet Take 650 mg by mouth every 6 (six) hours as needed for mild pain.    [provider]  amoxicillin-clavulanate (AUGMENTIN) 875-125 MG tablet Take 1 tablet by mouth every 12 (twelve) hours. 09/14/18   Kasir Hallenbeck, Tinnie Gens, PA-C  azithromycin (ZITHROMAX) 250 MG tablet Take 1 tablet daily beginning tomorrow (07/21/2017) for 4 days 07/20/17   Michela Pitcher A, PA-C  benzonatate (TESSALON) 100 MG capsule Take 1 capsule (100 mg total) by mouth 3 (three) times daily as needed for cough. 07/20/17   Fawze, Mina A, PA-C  ibuprofen (ADVIL,MOTRIN) 200 MG tablet Take 600-800 mg by mouth every 6 (six) hours as needed for moderate pain.    [provider]  ondansetron (ZOFRAN) 4 MG tablet Take 1 tablet (4 mg total) by mouth every 6 (six) hours. Patient not  taking: Reported on 09/10/2016 08/11/16   Raeford Razor, MD  potassium chloride (K-DUR) 10 MEQ tablet Take 1 tablet (10 mEq total) by mouth daily for 5 days. 07/20/17 07/25/17  Michela Pitcher A, PA-C  Pseudoeph-Doxylamine-DM-APAP (NYQUIL PO) Take 1 capsule by mouth every 8 (eight) hours as needed (cold).    [provider]  sulfamethoxazole-trimethoprim (BACTRIM DS,SEPTRA DS) 800-160 MG tablet Take 1 tablet by mouth 2 (two) times daily for 7 days. 09/14/18 09/21/18  Eyvonne Mechanic, PA-C    Family History History reviewed. No pertinent family history.  Social History Social History   Tobacco Use  . Smoking status: Current Every Day Smoker    Packs/day: 0.00    Types: Cigars  . Smokeless tobacco: Never Used  Substance Use Topics  . Alcohol use: No  . Drug use: No     Allergies   Patient has no known allergies.   Review of Systems Review of Systems  All other systems reviewed and are negative.    Physical Exam Updated Vital Signs BP 138/74 (BP Location: Right Arm)   Pulse 75   Temp 98.1 F (36.7 C) (Oral)   Resp 20   SpO2 100%   Physical Exam Vitals signs and nursing note reviewed.  Constitutional:      Appearance: He is well-developed.  HENT:     Head: Normocephalic  and atraumatic.  Eyes:     General: No scleral icterus.       Right eye: No discharge.        Left eye: No discharge.     Conjunctiva/sclera: Conjunctivae normal.     Pupils: Pupils are equal, round, and reactive to light.  Neck:     Musculoskeletal: Normal range of motion.     Vascular: No JVD.     Trachea: No tracheal deviation.  Pulmonary:     Effort: Pulmonary effort is normal.     Breath sounds: No stridor.  Musculoskeletal:     Comments: Breakdown noted in between the right third and fourth toes with surrounding edema and erythema, no swelling to the proximal dorsal foot and ankle or remainder of extremity- small amount of purulence noted not fluctuance or abscess noted   Neurological:      Mental Status: He is alert and oriented to person, place, and time.     Coordination: Coordination normal.  Psychiatric:        Behavior: Behavior normal.        Thought Content: Thought content normal.        Judgment: Judgment normal.      ED Treatments / Results  Labs (all labs ordered are listed, but only abnormal results are displayed) Labs Reviewed  BASIC METABOLIC PANEL    EKG None  Radiology Dg Foot Complete Right  Result Date: 09/14/2018 CLINICAL DATA:  Right fourth toe pain, atraumatic. EXAM: RIGHT FOOT COMPLETE - 3+ VIEW COMPARISON:  12/21/2015 FINDINGS: Mild skin irregularity along the mildly swollen and symptomatic fourth digit. The adjacent third digit skin surface is also irregular. No opaque foreign body, fracture, or erosion. IMPRESSION: Mild fourth digit soft tissue swelling and skin irregularity along the third and fourth digits. No opaque foreign body or underlying osseous abnormality. Electronically Signed   By: Marnee Spring M.D.   On: 09/14/2018 06:23    Procedures Procedures (including critical care time)  Medications Ordered in ED Medications  acetaminophen (TYLENOL) tablet 650 mg (650 mg Oral Given 09/14/18 0841)     Initial Impression / Assessment and Plan / ED Course  I have reviewed the triage vital signs and the nursing notes.  Pertinent labs & imaging results that were available during my care of the patient were reviewed by me and considered in my medical decision making (see chart for details).      33 year old male presents today with complaints of foot pain.  Patient has cellulitis on the right lower foot and toes.  Patient does have some purulence in the interdigit space but no fluid collection amenable to drainage.  Patient will be placed on antibiotics and encouraged to return if symptoms do not improve or immediately if they worsen.  Patient verbalized understanding and agreement to today's plan had no further questions or  concerns Final Clinical Impressions(s) / ED Diagnoses   Final diagnoses:  Cellulitis of toe of right foot    ED Discharge Orders         Ordered    sulfamethoxazole-trimethoprim (BACTRIM DS,SEPTRA DS) 800-160 MG tablet  2 times daily     09/14/18 0934    amoxicillin-clavulanate (AUGMENTIN) 875-125 MG tablet  Every 12 hours     09/14/18 0934           Eyvonne Mechanic, PA-C 09/14/18 1341    Terrilee Files, MD 09/14/18 [phone]

## 2019-11-14 IMAGING — DX DG FOOT COMPLETE 3+V*R*
3 series · 3 of 3 positions shown · non-contrast
Comparison: 12/21/2015

CLINICAL DATA: Right fourth toe pain, atraumatic.

EXAM:
RIGHT FOOT COMPLETE - 3+ VIEW

[foot ap]
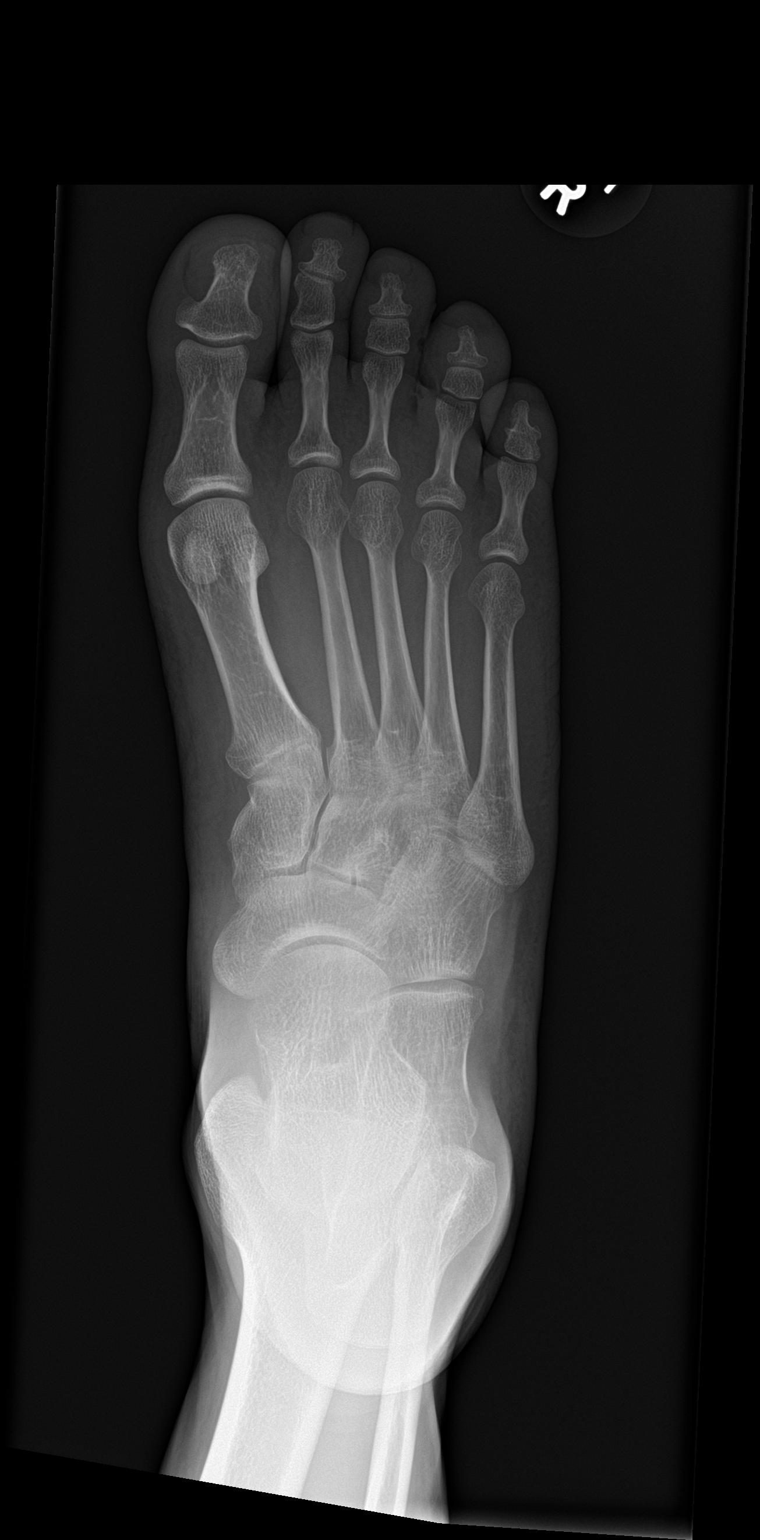

[foot obl]
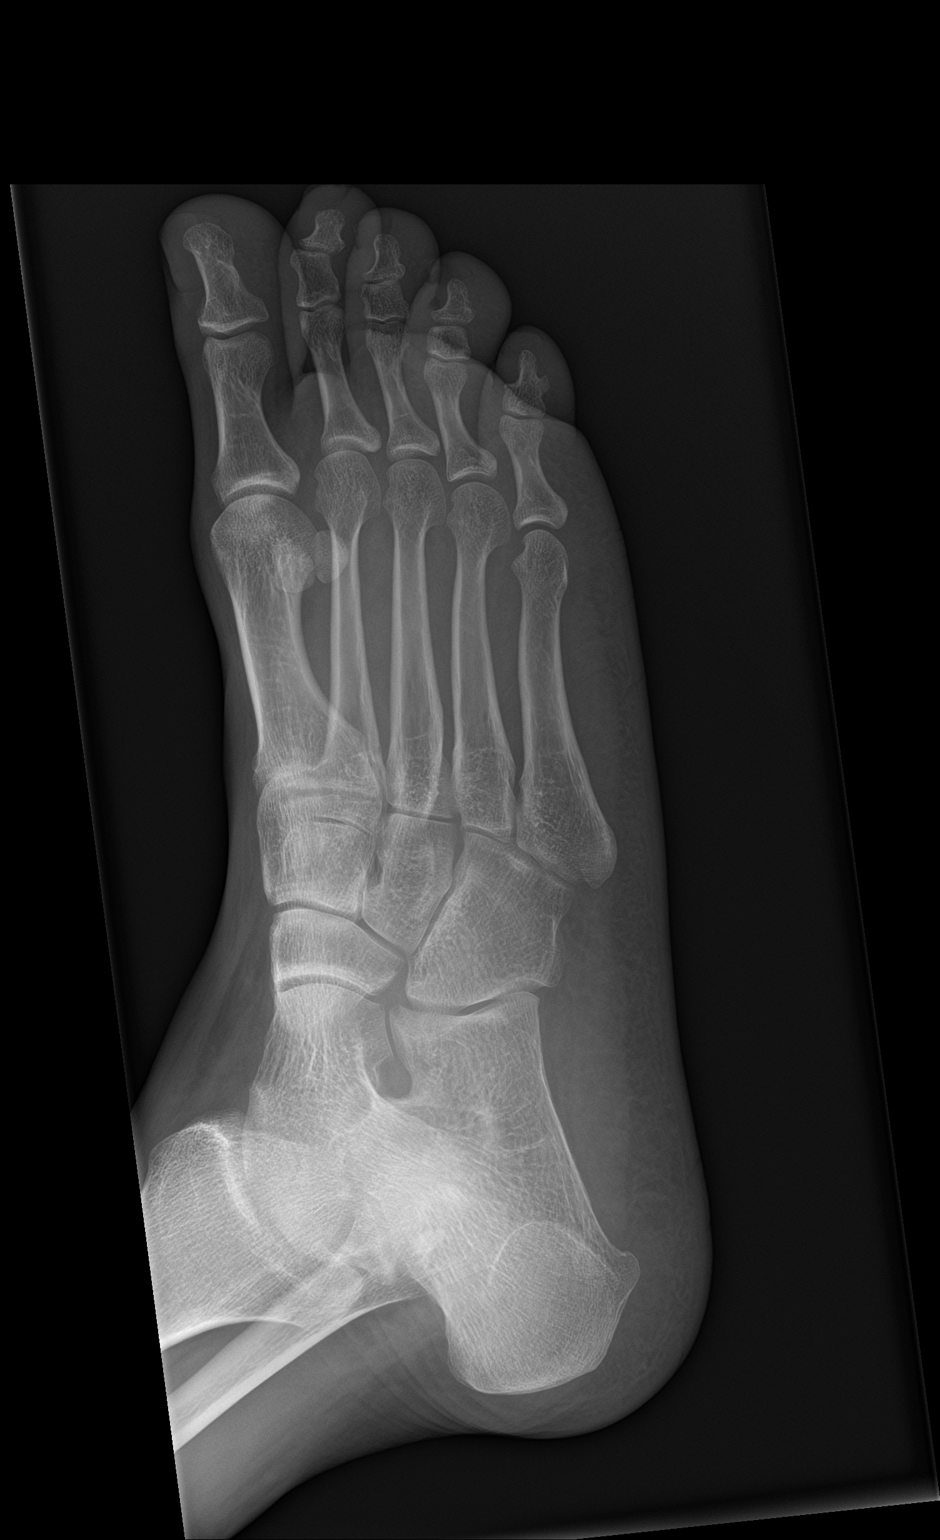

[foot lat]
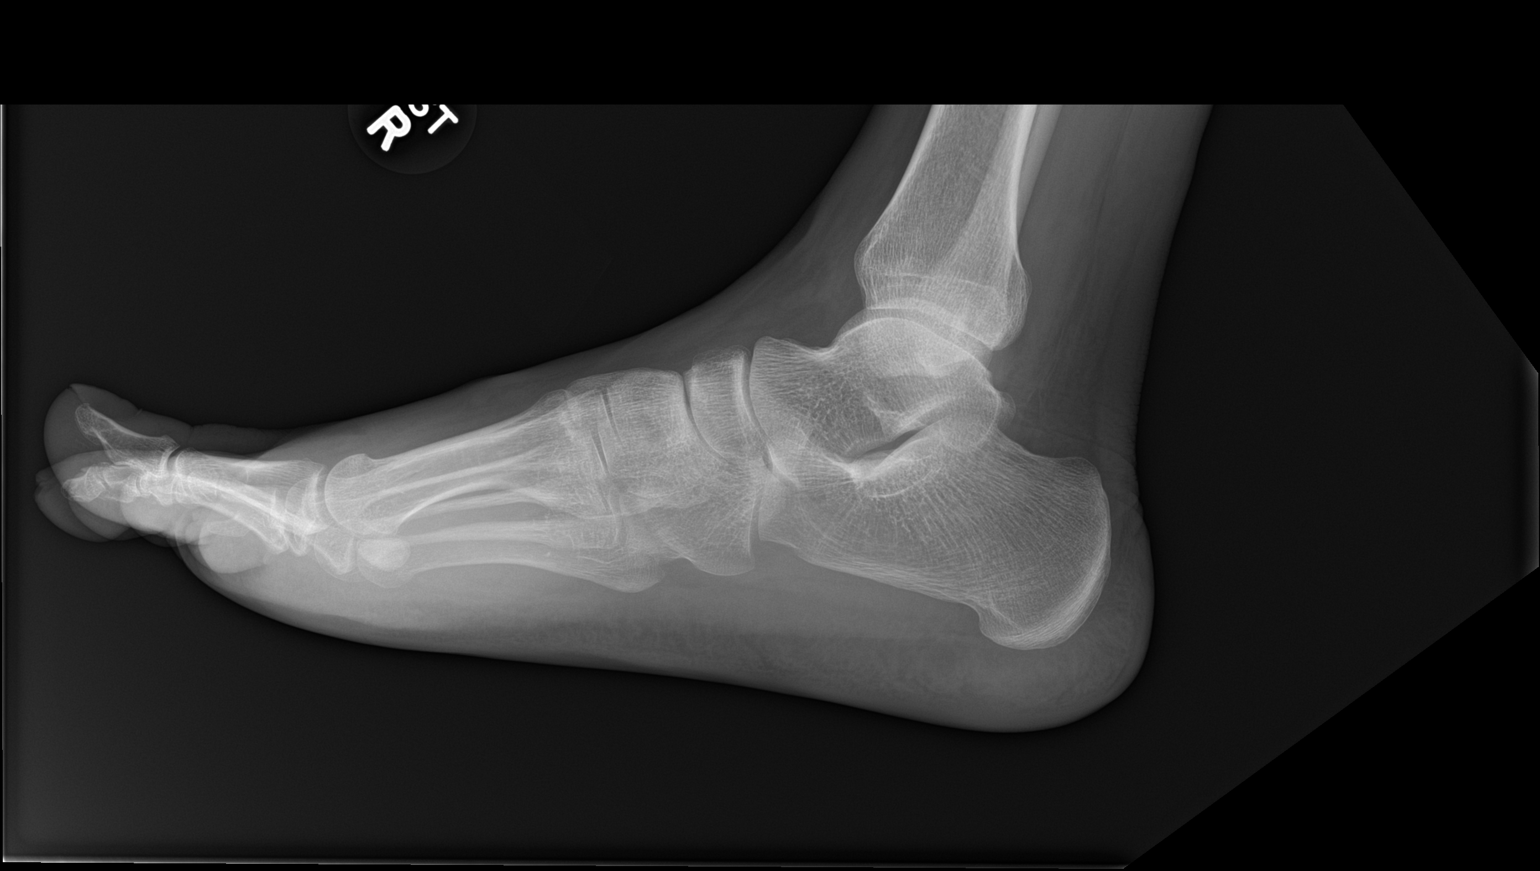

[3 of 3 positions shown; findings below may reference images not displayed]

FINDINGS: Mild skin irregularity along the mildly swollen and symptomatic
fourth digit. The adjacent third digit skin surface is also
irregular. No opaque foreign body, fracture, or erosion.
IMPRESSION: Mild fourth digit soft tissue swelling and skin irregularity along
the third and fourth digits. No opaque foreign body or underlying
osseous abnormality.

## 2020-03-31 ENCOUNTER — Other Ambulatory Visit (HOSPITAL_COMMUNITY)
Admission: RE | Admit: 2020-03-31 | Discharge: 2020-03-31 | Disposition: A | Discharge status: Home / Self Care | Payer: BC Managed Care – PPO | Source: Ambulatory Visit | Attending: Family Medicine | Admitting: Family Medicine

## 2020-03-31 ENCOUNTER — Ambulatory Visit: Payer: BC Managed Care – PPO | Admitting: Family Medicine

## 2020-03-31 ENCOUNTER — Encounter: Payer: Self-pay | Admitting: Family Medicine

## 2020-03-31 ENCOUNTER — Other Ambulatory Visit: Payer: Self-pay

## 2020-03-31 VITALS — BP 110/70 | HR 83 | Temp 98.9°F | Ht 72.0 in | Wt 142.0 lb

## 2020-03-31 DIAGNOSIS — Z114 Encounter for screening for human immunodeficiency virus [HIV]: Secondary | ICD-10-CM

## 2020-03-31 DIAGNOSIS — Z113 Encounter for screening for infections with a predominantly sexual mode of transmission: Secondary | ICD-10-CM

## 2020-03-31 DIAGNOSIS — Z13220 Encounter for screening for lipoid disorders: Secondary | ICD-10-CM

## 2020-03-31 DIAGNOSIS — Z1159 Encounter for screening for other viral diseases: Secondary | ICD-10-CM | POA: Diagnosis not present

## 2020-03-31 DIAGNOSIS — Q8781 Alport syndrome: Secondary | ICD-10-CM | POA: Diagnosis not present

## 2020-03-31 DIAGNOSIS — N509 Disorder of male genital organs, unspecified: Secondary | ICD-10-CM | POA: Diagnosis not present

## 2020-03-31 DIAGNOSIS — Z131 Encounter for screening for diabetes mellitus: Secondary | ICD-10-CM

## 2020-03-31 LAB — POCT URINALYSIS DIP (MANUAL ENTRY)
Bilirubin, UA: NEGATIVE
Blood, UA: NEGATIVE
Glucose, UA: NEGATIVE mg/dL
Ketones, POC UA: NEGATIVE mg/dL
Leukocytes, UA: NEGATIVE
Nitrite, UA: NEGATIVE
Protein Ur, POC: NEGATIVE mg/dL
Spec Grav, UA: 1.02 (ref 1.010–1.025)
Urobilinogen, UA: 0.2 E.U./dL
pH, UA: 8.5 — AB (ref 5.0–8.0)

## 2020-03-31 NOTE — Patient Instructions (Addendum)
  Area of concern on your testicles appears to be normal epididymis or normal structure.  I will check an ultrasound to make sure.  Other screening labs were also obtained.  Let me know if you have questions and nice meeting you today.  Return to the clinic or go to the nearest emergency room if any of your symptoms worsen or new symptoms occur.    If you have lab work done today you will be contacted with your lab results within the next 2 weeks.  If you have not heard from [place] then please contact us. The fastest way to get your results is to register for My Chart.   IF you received an x-ray today, you will receive an invoice from [facility]. Please contact [facility] at [phone] with questions or concerns regarding your invoice.   IF you received labwork today, you will receive an invoice from [facility]. Please contact [facility] at [phone] with questions or concerns regarding your invoice.   Our billing staff will not be able to assist you with questions regarding bills from these companies.  You will be contacted with the lab results as soon as they are available. The fastest way to get your results is to activate your My Chart account. Instructions are located on the last page of this paperwork. If you have not heard from [place] regarding the results in 2 weeks, please contact this office.

## 2020-03-31 NOTE — Progress Notes (Signed)
Subjective:  Patient ID: Cory Tucker, male    DOB: 1985/08/20  Age: 34 y.o. MRN: [id number]  CC:  Chief Complaint  Patient presents with  . Establish Care    Pt reports as gar as his general health he feels good, but pt states while shoering one day he noticed a lump on both of his testicals and wanted to get the providers thoughts. pt reports no risky sexual behavior, no pain or issues urinating.    HPI Cory Tucker presents for   New patient with concern of lumps on testicles. Noticed with shaving genitals in the shower. Noted on the top of testicle - initially noticed on left but also feels on right. No pain.  No dysuria/penile d/c. No urinary symptoms  No hematuria or hematospermia.  No known prior bump in area. No recent injury.   Sexually active- no condoms, but same partner for 10 years. Agrees to STI screening. No known hx of STI.   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  No recent testing. No meds in past that he knows of.  No vision or hearing issues. No corrective lenses.   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  3yo and 8yo sons.    HM:  Declines flu vaccine.  Declines covid vaccine, no questions.    History Patient Active Problem List   Diagnosis Date Noted  . URI 07/21/2010  . TOBACCO USER 06/14/2009   Past Medical History:  Diagnosis Date  . Alport syndrome   . Alport syndrome   . Hypokalemia   . Renal disorder    History reviewed. No pertinent surgical history. No Known Allergies Prior to Admission medications   Not on File   Social History   Socioeconomic History  . Marital status: Single    Spouse name: Not on file  . Number of children: Not on file  . Years of education: Not on file  . Highest education level: Not on file  Occupational History  . Not on file  Tobacco Use  . Smoking status: Current Every Day Smoker    Packs/day: 0.00    Types: Cigars  . Smokeless tobacco: Never Used  Vaping Use  . Vaping Use:  Never used  Substance and Sexual Activity  . Alcohol use: No  . Drug use: Yes    Types: Marijuana  . Sexual activity: Yes  Other Topics Concern  . Not on file  Social History Narrative  . Not on file   Social Determinants of Health   Financial Resource Strain:   . Difficulty of Paying Living Expenses: Not on file  Food Insecurity:   . Worried About Programme researcher, broadcasting/film/video in the Last Year: Not on file  . Ran Out of Food in the Last Year: Not on file  Transportation Needs:   . Lack of Transportation (Medical): Not on file  . Lack of Transportation (Non-Medical): Not on file  Physical Activity:   . Days of Exercise per Week: Not on file  . Minutes of Exercise per Session: Not on file  Stress:   . Feeling of Stress : Not on file  Social Connections:   . Frequency of Communication with Friends and Family: Not on file  . Frequency of Social Gatherings with Friends and Family: Not on file  . Attends Religious Services: Not on file  . Active Member of Clubs or Organizations: Not on file  . Attends Banker Meetings: Not on file  .  Marital Status: Not on file  Intimate Partner Violence:   . Fear of Current or Ex-Partner: Not on file  . Emotionally Abused: Not on file  . Physically Abused: Not on file  . Sexually Abused: Not on file    Review of Systems Per ROS  Objective:   Vitals:   03/31/20 1033  BP: 110/70  Pulse: 83  Temp: 98.9 F (37.2 C)  TempSrc: Temporal  SpO2: 99%  Weight: 142 lb (64.4 kg)  Height: 6' (1.829 m)    Physical Exam Vitals reviewed.  Constitutional:      Appearance: He is well-developed.  HENT:     Head: Normocephalic and atraumatic.  Eyes:     Pupils: Pupils are equal, round, and reactive to light.  Neck:     Vascular: No carotid bruit or JVD.  Cardiovascular:     Rate and Rhythm: Normal rate and regular rhythm.     Heart sounds: Normal heart sounds. No murmur heard.   Pulmonary:     Effort: Pulmonary effort is normal.      Breath sounds: Normal breath sounds. No rales.  Genitourinary:    Penis: Normal and circumcised. No erythema, discharge or lesions.      Testes:        Right: Tenderness or swelling not present.        Left: Tenderness or swelling not present.     Epididymis:     Right: Enlarged (Possible slight prominent right greater than left head.  Nontender.  No apparent testicular nodules.). No tenderness.     Left: Enlarged. No tenderness.    Lymphadenopathy:     Lower Body: No right inguinal adenopathy. No left inguinal adenopathy.  Skin:    General: Skin is warm and dry.  Neurological:     Mental Status: He is alert and oriented to person, place, and time.      Assessment & Plan:  Cory Tucker is a 34 y.o. male . Testicular abnormality  -Suspect area of concern in his epididymis but will check ultrasound.  Need for hepatitis C screening test - Plan: Hepatitis C antibody Encounter for screening for HIV - Plan: HIV antibody (with reflex) Routine screening for STI (sexually transmitted infection) - Plan: HIV antibody (with reflex)  -Check STI screening with HIV, RPR, chlamydia/gonorrhea testing and hepatitis C.  Alport syndrome  -No previous meds or recent meds none.  Will check renal function as well as microalbumin.  May need to coordinate with nephrology.  Screening for diabetes mellitus Screening for hyperlipidemia  -Check CMP, lipids.  No orders of the defined types were placed in this encounter.  Patient Instructions    Area of concern on your testicles appears to be normal epididymis or normal structure.  I will check an ultrasound to make sure.  Other screening labs were also obtained.  Let me know if you have questions and nice meeting you today.  Return to the clinic or go to the nearest emergency room if any of your symptoms worsen or new symptoms occur.    If you have lab work done today you will be contacted with your lab results within the next 2 weeks.  If you  have not heard from [place] then please contact us. The fastest way to get your results is to register for My Chart.   IF you received an x-ray today, you will receive an invoice from [facility]. Please contact [facility] at [phone] with questions or concerns regarding your invoice.  IF you received labwork today, you will receive an invoice from [facility]. Please contact [facility] at [phone] with questions or concerns regarding your invoice.   Our billing staff will not be able to assist you with questions regarding bills from these companies.  You will be contacted with the lab results as soon as they are available. The fastest way to get your results is to activate your My Chart account. Instructions are located on the last page of this paperwork. If you have not heard from [place] regarding the results in 2 weeks, please contact this office.         Signed, Merri Ray, MD [facility]

## 2020-04-01 LAB — HEPATITIS C ANTIBODY: Hep C Virus Ab: <0.1 s/co ratio (ref 0.0–0.9)

## 2020-04-01 LAB — GC/CHLAMYDIA PROBE AMP (~~LOC~~) NOT AT ARMC
Chlamydia: NEGATIVE
Comment: NEGATIVE
Comment: NORMAL
Neisseria Gonorrhea: NEGATIVE

## 2020-04-01 LAB — COMPREHENSIVE METABOLIC PANEL
ALT: 17 IU/L (ref 0–44)
AST: 26 IU/L (ref 0–40)
Albumin/Globulin Ratio: 2.1 (ref 1.2–2.2)
Albumin: 5.1 g/dL — ABNORMAL HIGH (ref 4.0–5.0)
Alkaline Phosphatase: 65 IU/L (ref 48–121)
BUN/Creatinine Ratio: 7 — ABNORMAL LOW (ref 9–20)
BUN: 8 mg/dL (ref 6–20)
Bilirubin Total: 0.2 mg/dL (ref 0.0–1.2)
CO2: 25 mmol/L (ref 20–29)
Calcium: 10.2 mg/dL (ref 8.7–10.2)
Chloride: 101 mmol/L (ref 96–106)
Creatinine, Ser: 1.11 mg/dL (ref 0.76–1.27)
GFR calc Af Amer: 100 mL/min/{1.73_m2} (ref >59)
GFR calc non Af Amer: 86 mL/min/{1.73_m2} (ref >59)
Globulin, Total: 2.4 g/dL (ref 1.5–4.5)
Glucose: 83 mg/dL (ref 65–99)
Potassium: 4.9 mmol/L (ref 3.5–5.2)
Sodium: 141 mmol/L (ref 134–144)
Total Protein: 7.5 g/dL (ref 6.0–8.5)

## 2020-04-01 LAB — LIPID PANEL
Chol/HDL Ratio: 3.1 ratio (ref 0.0–5.0)
Cholesterol, Total: 210 mg/dL — ABNORMAL HIGH (ref 100–199)
HDL: 68 mg/dL (ref >39)
LDL Chol Calc (NIH): 132 mg/dL — ABNORMAL HIGH (ref 0–99)
Triglycerides: 57 mg/dL (ref 0–149)
VLDL Cholesterol Cal: 10 mg/dL (ref 5–40)

## 2020-04-01 LAB — RPR: RPR Ser Ql: NONREACTIVE

## 2020-04-01 LAB — MICROALBUMIN / CREATININE URINE RATIO
Creatinine, Urine: 91.7 mg/dL
Microalb/Creat Ratio: 6 mg/g creat (ref 0–29)
Microalbumin, Urine: 5.7 ug/mL

## 2020-04-01 LAB — HIV ANTIBODY (ROUTINE TESTING W REFLEX): HIV Screen 4th Generation wRfx: NONREACTIVE

## 2021-06-08 ENCOUNTER — Other Ambulatory Visit: Payer: Self-pay

## 2021-06-08 ENCOUNTER — Ambulatory Visit (HOSPITAL_COMMUNITY)
Admission: RE | Admit: 2021-06-08 | Discharge: 2021-06-08 | Disposition: A | Discharge status: Home / Self Care | Payer: BC Managed Care – PPO | Source: Ambulatory Visit | Attending: Family Medicine | Admitting: Family Medicine

## 2021-06-08 ENCOUNTER — Encounter (HOSPITAL_COMMUNITY): Payer: Self-pay

## 2021-06-08 VITALS — BP 118/82 | HR 97 | Temp 99.0°F | Resp 18

## 2021-06-08 DIAGNOSIS — R590 Localized enlarged lymph nodes: Secondary | ICD-10-CM

## 2021-06-08 DIAGNOSIS — J069 Acute upper respiratory infection, unspecified: Secondary | ICD-10-CM | POA: Diagnosis not present

## 2021-06-08 NOTE — ED Triage Notes (Signed)
Patient reports feeling bad for one week.  Patient has been taking tylenol cold medicine.  Patient complains of sore throat, feels a bump under chin, cough

## 2021-06-10 NOTE — ED Provider Notes (Signed)
  [facility]   [id number] 06/08/21 Arrival Time: 1301  ASSESSMENT & PLAN:  1. Viral URI with cough   2. Submandibular lymphadenopathy     Discussed typical duration of viral illnesses. COVID-19 testing sent. No signs of bacterial infection. OTC symptom care as needed.    Follow-up Information     [facility] .   Why: If the lumps in your neck don't resolve within the next 1-2 weeks. Contact information: [address] [place] [address] [phone]                 Reviewed expectations re: course of current medical issues. Questions answered. Outlined signs and symptoms indicating need for more acute intervention. Understanding verbalized. After Visit Summary given.   SUBJECTIVE: History from: patient. Cory Tucker is a 35 y.o. male who reports URI symptoms; past week; fatigued. Mild ST. All improving. "Small lumbs on my neck". Noted today. Denies: fever. Normal PO intake without n/v/d.   OBJECTIVE:  Vitals:   06/08/21 1325  BP: 118/82  Pulse: 97  Resp: 18  Temp: 99 F (37.2 C)  TempSrc: Oral  SpO2: 96%    General appearance: alert; no distress Eyes: PERRLA; EOMI; conjunctiva normal HENT: [place]; AT; with nasal congestion Neck: supple with small cervical and sublingual LAD; non tender; throat appears normal Lungs: speaks full sentences without difficulty; unlabored Extremities: no edema Skin: warm and dry Neurologic: normal gait Psychological: alert and cooperative; normal mood and affect  No Known Allergies  Past Medical History:  Diagnosis Date   Alport syndrome    Alport syndrome    Hypokalemia    Renal disorder    Social History   Socioeconomic History   Marital status: Single    Spouse name: Not on file   Number of children: Not on file   Years of education: Not on file   Highest education level: Not on file  Occupational History   Not on file  Tobacco Use   Smoking  status: Every Day    Packs/day: 0.00    Types: Cigars, Cigarettes   Smokeless tobacco: Never  Vaping Use   Vaping Use: Never used  Substance and Sexual Activity   Alcohol use: No   Drug use: Not Currently    Types: Marijuana   Sexual activity: Yes  Other Topics Concern   Not on file  Social History Narrative   Not on file   Social Determinants of Health   Financial Resource Strain: Not on file  Food Insecurity: Not on file  Transportation Needs: Not on file  Physical Activity: Not on file  Stress: Not on file  Social Connections: Not on file  Intimate Partner Violence: Not on file   Family History  Problem Relation Age of Onset   Healthy Mother    Healthy Father    History reviewed. No pertinent surgical history.   Mardella Layman, MD 06/10/21 [phone]

## 2021-07-06 ENCOUNTER — Other Ambulatory Visit: Payer: Self-pay

## 2021-07-06 ENCOUNTER — Encounter: Payer: Self-pay | Admitting: Family

## 2021-07-06 ENCOUNTER — Ambulatory Visit (INDEPENDENT_AMBULATORY_CARE_PROVIDER_SITE_OTHER): Payer: BC Managed Care – PPO | Admitting: Family

## 2021-07-06 VITALS — BP 102/68 | HR 84 | Temp 95.8°F | Ht 72.0 in | Wt 137.6 lb

## 2021-07-06 DIAGNOSIS — Z0000 Encounter for general adult medical examination without abnormal findings: Secondary | ICD-10-CM | POA: Diagnosis not present

## 2021-07-06 DIAGNOSIS — R636 Underweight: Secondary | ICD-10-CM | POA: Diagnosis not present

## 2021-07-06 DIAGNOSIS — J302 Other seasonal allergic rhinitis: Secondary | ICD-10-CM | POA: Diagnosis not present

## 2021-07-06 HISTORY — DX: Underweight: R63.6

## 2021-07-06 HISTORY — DX: Encounter for general adult medical examination without abnormal findings: Z00.00

## 2021-07-06 LAB — CBC WITH DIFFERENTIAL/PLATELET
Basophils Absolute: 0 10*3/uL (ref 0.0–0.1)
Basophils Relative: 0.3 % (ref 0.0–3.0)
Eosinophils Absolute: 0 10*3/uL (ref 0.0–0.7)
Eosinophils Relative: 0.6 % (ref 0.0–5.0)
HCT: 44.7 % (ref 39.0–52.0)
Hemoglobin: 14.6 g/dL (ref 13.0–17.0)
Lymphocytes Relative: 39.8 % (ref 12.0–46.0)
Lymphs Abs: 2 10*3/uL (ref 0.7–4.0)
MCHC: 32.7 g/dL (ref 30.0–36.0)
MCV: 86.7 fl (ref 78.0–100.0)
Monocytes Absolute: 0.5 10*3/uL (ref 0.1–1.0)
Monocytes Relative: 9.1 % (ref 3.0–12.0)
Neutro Abs: 2.5 10*3/uL (ref 1.4–7.7)
Neutrophils Relative %: 50.2 % (ref 43.0–77.0)
Platelets: 212 10*3/uL (ref 150.0–400.0)
RBC: 5.15 Mil/uL (ref 4.22–5.81)
RDW: 14.5 % (ref 11.5–15.5)
WBC: 4.9 10*3/uL (ref 4.0–10.5)

## 2021-07-06 LAB — LIPID PANEL
Cholesterol: 151 mg/dL (ref 0–200)
HDL: 51.1 mg/dL (ref >39.00)
LDL Cholesterol: 90 mg/dL (ref 0–99)
NonHDL: 100.11
Total CHOL/HDL Ratio: 3
Triglycerides: 52 mg/dL (ref 0.0–149.0)
VLDL: 10.4 mg/dL (ref 0.0–40.0)

## 2021-07-06 LAB — COMPREHENSIVE METABOLIC PANEL
ALT: 12 U/L (ref 0–53)
AST: 17 U/L (ref 0–37)
Albumin: 4.6 g/dL (ref 3.5–5.2)
Alkaline Phosphatase: 67 U/L (ref 39–117)
BUN: 10 mg/dL (ref 6–23)
CO2: 28 mEq/L (ref 19–32)
Calcium: 9.7 mg/dL (ref 8.4–10.5)
Chloride: 105 mEq/L (ref 96–112)
Creatinine, Ser: 1 mg/dL (ref 0.40–1.50)
GFR: 97 mL/min (ref >60.00)
Glucose, Bld: 85 mg/dL (ref 70–99)
Potassium: 4.2 mEq/L (ref 3.5–5.1)
Sodium: 140 mEq/L (ref 135–145)
Total Bilirubin: 0.5 mg/dL (ref 0.2–1.2)
Total Protein: 7.1 g/dL (ref 6.0–8.3)

## 2021-07-06 LAB — TSH: TSH: 1.31 u[IU]/mL (ref 0.35–5.50)

## 2021-07-06 NOTE — Assessment & Plan Note (Signed)
Pt with same partner for 10 years, 2 kids, works full time as a Naval architect. Hx of Alport syndrome, but has no sequelae, doing well. Only concerns today are newly developed allergy sx, seeing ENT today, also inability to gain weight, has healthy appetite.

## 2021-07-06 NOTE — Assessment & Plan Note (Signed)
Seeing ENT today, started a few months ago, unsure if just Ragweed pollen or other allergens. Denies hx prior to this fall.

## 2021-07-06 NOTE — Progress Notes (Signed)
Phone: [phone]   Subjective:  Patient 35 y.o. male presenting for annual physical.  Chief Complaint  Patient presents with   Establish Care   Annual Exam   URI    Pt has app with ENT today @ 1:30p    See problem oriented charting- ROS- full  review of systems was completed and negative except for allergies. Allergic Rhinitis: Karver Fadden is here for evaluation of possible allergic rhinitis. Patient's symptoms include clear rhinorrhea, itchy eyes, nasal congestion, postnasal drip, sneezing, and watery eyes. These symptoms are seasonal. Current triggers include exposure to no known precipitant. The patient has been suffering from these symptoms for approximately 4 months. The patient has tried  nothing . Immunotherapy has never been tried. The patient has never had nasal polyps. The patient has no history of asthma. The patient does not suffer from frequent sinopulmonary infections. The patient has not had sinus surgery in the past. The patient has no history of eczema. He reports he has an appt with ENT today for further assessment.   The following were reviewed and entered/updated in epic: Past Medical History:  Diagnosis Date   Alport syndrome    Alport syndrome    Hypokalemia    Renal disorder    Patient Active Problem List   Diagnosis Date Noted   Seasonal allergic rhinitis 07/06/2021   Annual physical exam 07/06/2021   Underweight 07/06/2021   URI 07/21/2010   TOBACCO USER 06/14/2009   History reviewed. No pertinent surgical history.  Family History  Problem Relation Age of Onset   Healthy Mother    Healthy Father     Medications- reviewed and updated No current outpatient medications on file.   No current facility-administered medications for this visit.    Allergies-reviewed and updated No Known Allergies  Social History   Social History Narrative   Not on file   Objective  Objective:  BP 102/68   Pulse 84   Temp (!) 95.8 F (35.4 C)  (Temporal)   Ht 6' (1.829 m)   Wt 137 lb 9.6 oz (62.4 kg)   SpO2 100%   BMI 18.66 kg/m  Gen: NAD, resting comfortably HEENT: Mucous membranes are moist. Oropharynx normal Neck: no thyromegaly CV: RRR no murmurs rubs or gallops Lungs: CTAB no crackles, wheeze, rhonchi Abdomen: soft/nontender/nondistended/normal bowel sounds. No rebound or guarding.  Ext: no edema Skin: warm, dry Neuro: grossly normal, moves all extremities, PERRLA    Assessment and Plan   Health Maintenance counseling: 1. Anticipatory guidance: Patient counseled regarding regular dental exams q6 months, eye exams yearly, avoiding smoking and second hand smoke, limiting alcohol to 2 beverages per day.   2. Risk factor reduction:  Advised patient of need for regular exercise and diet rich in fruits and vegetables to reduce risk of heart attack and stroke. Exercise- none, educated on importance for overall health & wellbeing.   Wt Readings from Last 3 Encounters:  07/06/21 137 lb 9.6 oz (62.4 kg)  03/31/20 142 lb (64.4 kg)  09/10/16 136 lb (61.7 kg)   3. Immunizations/screenings/ancillary studies- pt refuses all vaccines, despite extensive teaching.  There is no immunization history on file for this patient. Health Maintenance Due  Topic Date Due   Pneumococcal Vaccine 55-36 Years old (1 - PCV) Never done   TETANUS/TDAP  Never done    4. Skin cancer screening- advised regular sunscreen use. Denies worrisome, changing, or new skin lesions.  5. Smoking associated screening:  smoker - 1 cigar qd. 6. STD  screening - N/A - same partner for 10 years.   Problem List Items Addressed This Visit       Respiratory   Seasonal allergic rhinitis - Primary    Seeing ENT today, started a few months ago, unsure if just Ragweed pollen or other allergens. Denies hx prior to this fall.        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 Hx of Alport syndrome,  but has no sequelae, doing well. Only concerns today are newly developed allergy sx, seeing ENT today, also inability to gain weight, has healthy appetite.      Relevant Orders   Comprehensive metabolic panel   TSH   Lipid panel   CBC with Differential/Platelet   Underweight    Pt reports being thin all his life, eats about 4 times per day. Denies any appetite loss. Advised on high calorie healthy foods, pt has to be careful with eating too much protein due to his Alport syndrome.       Recommended follow up: Return for as needed for any concerns. No future appointments.   Lab/Order associations: fasting   ICD-10-CM   1. Seasonal allergic rhinitis, unspecified trigger  J30.2     2. Annual physical exam  Z00.00 Comprehensive metabolic panel    TSH    Lipid panel    CBC with Differential/Platelet    3. Underweight  R63.6        Return precautions advised.  Dulce Sellar, NP

## 2021-07-06 NOTE — Assessment & Plan Note (Signed)
Pt reports being thin all his life, eats about 4 times per day. Denies any appetite loss. Advised on high calorie healthy foods, pt has to be careful with eating too much protein due to his Alport syndrome.

## 2021-07-06 NOTE — Patient Instructions (Signed)
Welcome to [facility]! It was a pleasure meeting you today.  Go to the lab for blood work today. I will review these results in a few days.  Have a wonderful holiday!  PLEASE NOTE:  If you had any LAB tests please let us know if you have not heard back within a few days. You may see your results on MyChart before we have a chance to review them but we will give you a call once they are reviewed by [place]. If we ordered any REFERRALS today, please let us know if you have not heard from their office within the next week.  Let us know through MyChart if you are needing REFILLS, or have your pharmacy send [place] the request. You can also use MyChart to communicate with me or any office staff.  Please try these tips to maintain a healthy lifestyle:  Eat most of your calories during the day when you are active. Eliminate processed foods including packaged sweets (pies, cakes, cookies), reduce intake of potatoes, white bread, white pasta, and white rice. Look for whole grain options, oat flour or almond flour.  Each meal should contain half fruits/vegetables, one quarter protein, and one quarter carbs (no bigger than a computer mouse).  Cut down on sweet beverages. This includes juice, soda, and sweet tea. Also watch fruit intake, though this is a healthier sweet option, it still contains natural sugar! Limit to 3 servings daily.  Drink at least 1 glass of water with each meal and aim for at least 8 glasses per day  Exercise at least 150 minutes every week.

## 2021-07-08 NOTE — Progress Notes (Signed)
Pt has not seen results yet -  All labs are within normal range. Glucose (sugar), electrolytes, kidney and liver function, blood count, thyroid, and cholesterol numbers are all good.   Keep up the good work with controlling your diet and continue to try and shoot for 30 minutes of exercise daily!  thanks

## 2022-04-26 ENCOUNTER — Encounter: Payer: Self-pay | Admitting: *Deleted

## 2022-07-15 ENCOUNTER — Encounter: Payer: Self-pay | Admitting: *Deleted

## 2023-03-14 NOTE — Telephone Encounter (Signed)
Please see message. °

## 2023-03-29 ENCOUNTER — Encounter: Payer: BC Managed Care – PPO | Admitting: Family

## 2023-04-07 ENCOUNTER — Ambulatory Visit (INDEPENDENT_AMBULATORY_CARE_PROVIDER_SITE_OTHER): Payer: BC Managed Care – PPO | Admitting: Family

## 2023-04-07 VITALS — BP 106/69 | HR 69 | Temp 97.8°F | Ht 72.0 in | Wt 142.2 lb

## 2023-04-07 DIAGNOSIS — Z23 Encounter for immunization: Secondary | ICD-10-CM

## 2023-04-07 DIAGNOSIS — Z0000 Encounter for general adult medical examination without abnormal findings: Secondary | ICD-10-CM | POA: Diagnosis not present

## 2023-04-07 DIAGNOSIS — F17200 Nicotine dependence, unspecified, uncomplicated: Secondary | ICD-10-CM

## 2023-04-07 DIAGNOSIS — Z113 Encounter for screening for infections with a predominantly sexual mode of transmission: Secondary | ICD-10-CM

## 2023-04-07 DIAGNOSIS — Q8781 Alport syndrome: Secondary | ICD-10-CM | POA: Insufficient documentation

## 2023-04-07 NOTE — Assessment & Plan Note (Signed)
chronic cigarettes, cigars Discussed importance of smoking cessation d/t ill effects on overall health, as well as different options available.  Encouraged smoking cessation.  Gauged progress on stages of change.  Provided [place] resource.

## 2023-04-07 NOTE — Patient Instructions (Signed)
It was very nice to see you today!   I will review your lab results via MyChart in a few days.   Have a wonderful fall season!      PLEASE NOTE:  If you had any lab tests please let us know if you have not heard back within a few days. You may see your results on MyChart before we have a chance to review them but we will give you a call once they are reviewed by [place]. If we ordered any referrals today, please let us know if you have not heard from their office within the next week.

## 2023-04-07 NOTE — Progress Notes (Signed)
Phone: [phone]   Subjective:  Patient 37 y.o. male presenting for annual physical.  Chief Complaint  Patient presents with   Annual Exam    Fasting w/ labs     See problem oriented charting- ROS- full  review of systems was completed and negative except for allergies.   The following were reviewed and entered/updated in epic: Past Medical History:  Diagnosis Date   Alport syndrome    Alport syndrome    Hypokalemia    Renal disorder    Patient Active Problem List   Diagnosis Date Noted   Seasonal allergic rhinitis 07/06/2021   Annual physical exam 07/06/2021   Underweight 07/06/2021   URI 07/21/2010   TOBACCO USER 06/14/2009   No past surgical history on file.  Family History  Problem Relation Age of Onset   Healthy Mother    Healthy Father     Medications- reviewed and updated No current outpatient medications on file.   No current facility-administered medications for this visit.    Allergies-reviewed and updated No Known Allergies  Social History   Social History Narrative   Not on file   Objective  Objective:  BP 106/69 (BP Location: Left Arm, Patient Position: Sitting, Cuff Size: Large)   Pulse 69   Temp 97.8 F (36.6 C) (Temporal)   Ht 6' (1.829 m)   Wt 142 lb 3.2 oz (64.5 kg)   SpO2 99%   BMI 19.29 kg/m  Gen: NAD, resting comfortably HEENT: Mucous membranes are moist. Oropharynx normal; pt has braces upper & lower intact. Neck: no thyromegaly CV: RRR no murmurs rubs or gallops Lungs: CTAB no crackles, wheeze, rhonchi Abdomen: soft/nontender/nondistended/normal bowel sounds. No rebound or guarding.  Ext: no edema Skin: warm, dry Neuro: grossly normal, moves all extremities, PERRLA    Assessment and Plan   Health Maintenance counseling: 1. Anticipatory guidance: Patient counseled regarding regular dental exams q6 months, eye exams yearly, avoiding smoking and second hand smoke, limiting alcohol to 2 beverages per day.   2. Risk  factor reduction:  Advised patient of need for regular exercise and diet rich in fruits and vegetables to reduce risk of heart attack and stroke. Exercise- gym 3-4d/week, educated on importance for overall health & wellbeing.   Wt Readings from Last 3 Encounters:  04/07/23 142 lb 3.2 oz (64.5 kg)  07/06/21 137 lb 9.6 oz (62.4 kg)  03/31/20 142 lb (64.4 kg)   3. Immunizations/screenings/ancillary studies- pt refuses all vaccines, despite extensive teaching.  There is no immunization history on file for this patient. Health Maintenance Due  Topic Date Due   DTaP/Tdap/Td (1 - Tdap) Never done    4. Skin cancer screening- advised regular sunscreen use. Denies worrisome, changing, or new skin lesions.  5. Smoking associated screening:  smoker - 1 cigar qd. 6. STD screening - N/A - same partner for 10 years.   Problem List Items Addressed This Visit     Annual physical exam - Primary   Relevant Orders   CBC with Differential/Platelet   Comprehensive metabolic panel   Lipid panel   Other Visit Diagnoses     Routine screening for STI (sexually transmitted infection)       Relevant Orders   HIV antibody (with reflex)   RPR   Urine cytology ancillary only       Recommended follow up: Return for any future concerns, Complete physical w/fasting labs. No future appointments.   Lab/Order associations: fasting   ICD-10-CM   1. Annual physical  exam  Z00.00 CBC with Differential/Platelet    Comprehensive metabolic panel    Lipid panel    2. Routine screening for STI (sexually transmitted infection)  Z11.3 HIV antibody (with reflex)    RPR    Urine cytology ancillary only      Return precautions advised.  Dulce Sellar, NP

## 2023-04-07 NOTE — Addendum Note (Signed)
Addended by: Lorn Junes on: 04/07/2023 02:55 PM   Modules accepted: Orders

## 2023-04-07 NOTE — Addendum Note (Signed)
Addended by: Lorn Junes on: 04/07/2023 03:02 PM   Modules accepted: Orders

## 2023-04-08 ENCOUNTER — Other Ambulatory Visit (INDEPENDENT_AMBULATORY_CARE_PROVIDER_SITE_OTHER): Payer: BC Managed Care – PPO

## 2023-04-08 ENCOUNTER — Encounter: Payer: BC Managed Care – PPO | Admitting: Family

## 2023-04-08 ENCOUNTER — Other Ambulatory Visit (HOSPITAL_COMMUNITY)
Admission: RE | Admit: 2023-04-08 | Discharge: 2023-04-08 | Disposition: A | Discharge status: Home / Self Care | Payer: BC Managed Care – PPO | Source: Ambulatory Visit | Attending: Family | Admitting: Family

## 2023-04-08 DIAGNOSIS — Z113 Encounter for screening for infections with a predominantly sexual mode of transmission: Secondary | ICD-10-CM

## 2023-04-08 DIAGNOSIS — Z0000 Encounter for general adult medical examination without abnormal findings: Secondary | ICD-10-CM

## 2023-04-08 LAB — CBC WITH DIFFERENTIAL/PLATELET
Basophils Absolute: 0 10*3/uL (ref 0.0–0.1)
Basophils Relative: 0.6 % (ref 0.0–3.0)
Eosinophils Absolute: 0 10*3/uL (ref 0.0–0.7)
Eosinophils Relative: 0.9 % (ref 0.0–5.0)
HCT: 44.7 % (ref 39.0–52.0)
Hemoglobin: 14.4 g/dL (ref 13.0–17.0)
Lymphocytes Relative: 47.2 % — ABNORMAL HIGH (ref 12.0–46.0)
Lymphs Abs: 2.6 10*3/uL (ref 0.7–4.0)
MCHC: 32.3 g/dL (ref 30.0–36.0)
MCV: 88.6 fl (ref 78.0–100.0)
Monocytes Absolute: 0.5 10*3/uL (ref 0.1–1.0)
Monocytes Relative: 9.2 % (ref 3.0–12.0)
Neutro Abs: 2.3 10*3/uL (ref 1.4–7.7)
Neutrophils Relative %: 42.1 % — ABNORMAL LOW (ref 43.0–77.0)
Platelets: 169 10*3/uL (ref 150.0–400.0)
RBC: 5.05 Mil/uL (ref 4.22–5.81)
RDW: 13.7 % (ref 11.5–15.5)
WBC: 5.4 10*3/uL (ref 4.0–10.5)

## 2023-04-08 LAB — COMPREHENSIVE METABOLIC PANEL
ALT: 9 U/L (ref 0–53)
AST: 14 U/L (ref 0–37)
Albumin: 4.3 g/dL (ref 3.5–5.2)
Alkaline Phosphatase: 68 U/L (ref 39–117)
BUN: 15 mg/dL (ref 6–23)
CO2: 28 meq/L (ref 19–32)
Calcium: 9.2 mg/dL (ref 8.4–10.5)
Chloride: 104 meq/L (ref 96–112)
Creatinine, Ser: 1.09 mg/dL (ref 0.40–1.50)
GFR: 86.4 mL/min (ref >60.00)
Glucose, Bld: 93 mg/dL (ref 70–99)
Potassium: 3.8 meq/L (ref 3.5–5.1)
Sodium: 138 meq/L (ref 135–145)
Total Bilirubin: 0.3 mg/dL (ref 0.2–1.2)
Total Protein: 6.6 g/dL (ref 6.0–8.3)

## 2023-04-08 LAB — LIPID PANEL
Cholesterol: 168 mg/dL (ref 0–200)
HDL: 53.8 mg/dL (ref >39.00)
LDL Cholesterol: 93 mg/dL (ref 0–99)
NonHDL: 113.86
Total CHOL/HDL Ratio: 3
Triglycerides: 102 mg/dL (ref 0.0–149.0)
VLDL: 20.4 mg/dL (ref 0.0–40.0)

## 2023-04-09 LAB — HIV ANTIBODY (ROUTINE TESTING W REFLEX): HIV 1&2 Ab, 4th Generation: NONREACTIVE

## 2023-04-09 LAB — RPR: RPR Ser Ql: NONREACTIVE

## 2023-04-11 LAB — URINE CYTOLOGY ANCILLARY ONLY
Chlamydia: NEGATIVE
Comment: NEGATIVE
Comment: NEGATIVE
Comment: NORMAL
Neisseria Gonorrhea: NEGATIVE
Trichomonas: NEGATIVE

## 2024-04-09 ENCOUNTER — Ambulatory Visit (INDEPENDENT_AMBULATORY_CARE_PROVIDER_SITE_OTHER): Admitting: Family

## 2024-04-09 ENCOUNTER — Encounter: Payer: Self-pay | Admitting: Family

## 2024-04-09 VITALS — BP 105/70 | HR 65 | Temp 98.1°F | Ht 72.0 in | Wt 146.5 lb

## 2024-04-09 DIAGNOSIS — Z0000 Encounter for general adult medical examination without abnormal findings: Secondary | ICD-10-CM

## 2024-04-09 DIAGNOSIS — Z113 Encounter for screening for infections with a predominantly sexual mode of transmission: Secondary | ICD-10-CM | POA: Diagnosis not present

## 2024-04-09 DIAGNOSIS — Z2821 Immunization not carried out because of patient refusal: Secondary | ICD-10-CM | POA: Diagnosis not present

## 2024-04-09 LAB — CBC WITH DIFFERENTIAL/PLATELET
Basophils Absolute: 0 K/uL (ref 0.0–0.1)
Basophils Relative: 0.4 % (ref 0.0–3.0)
Eosinophils Absolute: 0 K/uL (ref 0.0–0.7)
Eosinophils Relative: 0.9 % (ref 0.0–5.0)
HCT: 45.8 % (ref 39.0–52.0)
Hemoglobin: 14.9 g/dL (ref 13.0–17.0)
Lymphocytes Relative: 47.8 % — ABNORMAL HIGH (ref 12.0–46.0)
Lymphs Abs: 2.5 K/uL (ref 0.7–4.0)
MCHC: 32.5 g/dL (ref 30.0–36.0)
MCV: 87.9 fl (ref 78.0–100.0)
Monocytes Absolute: 0.4 K/uL (ref 0.1–1.0)
Monocytes Relative: 8.6 % (ref 3.0–12.0)
Neutro Abs: 2.2 K/uL (ref 1.4–7.7)
Neutrophils Relative %: 42.3 % — ABNORMAL LOW (ref 43.0–77.0)
Platelets: 173 K/uL (ref 150.0–400.0)
RBC: 5.21 Mil/uL (ref 4.22–5.81)
RDW: 13.9 % (ref 11.5–15.5)
WBC: 5.2 K/uL (ref 4.0–10.5)

## 2024-04-09 LAB — COMPREHENSIVE METABOLIC PANEL WITH GFR
ALT: 11 U/L (ref 0–53)
AST: 18 U/L (ref 0–37)
Albumin: 4.6 g/dL (ref 3.5–5.2)
Alkaline Phosphatase: 51 U/L (ref 39–117)
BUN: 9 mg/dL (ref 6–23)
CO2: 29 meq/L (ref 19–32)
Calcium: 9.7 mg/dL (ref 8.4–10.5)
Chloride: 103 meq/L (ref 96–112)
Creatinine, Ser: 1.08 mg/dL (ref 0.40–1.50)
GFR: 86.74 mL/min (ref >60.00)
Glucose, Bld: 83 mg/dL (ref 70–99)
Potassium: 3.6 meq/L (ref 3.5–5.1)
Sodium: 141 meq/L (ref 135–145)
Total Bilirubin: 0.7 mg/dL (ref 0.2–1.2)
Total Protein: 7.1 g/dL (ref 6.0–8.3)

## 2024-04-09 LAB — TSH: TSH: 3.28 u[IU]/mL (ref 0.35–5.50)

## 2024-04-09 NOTE — Progress Notes (Signed)
 Phone: [phone]   Subjective:  Patient 38 y.o. male presenting for annual physical.  Chief Complaint  Patient presents with   Annual Exam    Fasting w/ labs  Discussed the use of AI scribe software for clinical note transcription with the patient, who gave verbal consent to proceed.  History of Present Illness   Cory Tucker is a 38 year old male who presents for an annual physical exam and STD screening.  Sexual health and std screening - Sexually active male seeking STD testing to assess health status - Underwent similar STD testing one year ago  Tobacco use and smoking cessation - Attempting to quit smoking - Used nicotine gum, which caused irritability and frustration due to frequent use - Abstained from smoking for two days while using gum - Has not tried nicotine patches - Smoking is often triggered by stress or social situations  Physical activity and exercise tolerance - Walks a large, hyperactive dog daily for approximately two miles - This is his primary form of exercise  General health status - Feels well overall - No sleep disturbances - No significant weight changes     See problem oriented charting- ROS- full  review of systems was completed and negative except for what is noted in HPI above.  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  Family History  Problem Relation Age of Onset   Healthy Mother    Healthy Father     Medications- reviewed and updated No current outpatient medications on file.   No current  facility-administered medications for this visit.    Allergies-reviewed and updated No Known Allergies  Social History   Social History Narrative   Not on file   Objective  Objective:  BP 105/70 (BP Location: Left Arm, Patient Position: Sitting, Cuff Size: Large)   Pulse 65   Temp 98.1 F (36.7 C) (Temporal)   Ht 6' (1.829 m)   Wt 146 lb 8 oz (66.5 kg)   SpO2 95%   BMI 19.87 kg/m  Physical Exam Vitals and nursing note reviewed.  Constitutional:      Appearance: Normal appearance.  HENT:     Head: Normocephalic.     Nose: Nose normal.     Mouth/Throat:     Mouth: Mucous membranes are moist.  Eyes:     Pupils: Pupils are equal, round, and reactive to light.  Neck:     Thyroid: No thyromegaly.  Cardiovascular:     Rate and Rhythm: Normal rate and regular rhythm.  Pulmonary:     Effort: Pulmonary effort is normal.     Breath sounds: Normal breath sounds.  Musculoskeletal:        General: Normal range of motion.     Cervical back: Normal range of motion.  Lymphadenopathy:     Head:     Right side of head: No submental, submandibular, tonsillar, preauricular, posterior auricular or occipital adenopathy.     Left side of head: No submental, submandibular, tonsillar, preauricular, posterior auricular or occipital adenopathy.  Cervical: No cervical adenopathy.  Skin:    General: Skin is warm and dry.  Neurological:     Mental Status: He is alert and oriented to person, place, and time.  Psychiatric:        Mood and Affect: Mood normal.        Behavior: Behavior normal.      Assessment and Plan   Health Maintenance counseling: 1. Anticipatory guidance: Patient counseled regarding regular dental exams q6 months, eye exams yearly, avoiding smoking and second hand smoke, limiting alcohol to 2 beverages per day.   2. Risk factor reduction:  Advised patient of need for regular exercise and diet rich in fruits and vegetables to reduce risk of heart attack and stroke.  Exercise- gym 3-4d/week, educated on importance for overall health & wellbeing.   Wt Readings from Last 3 Encounters:  04/09/24 146 lb 8 oz (66.5 kg)  04/07/23 142 lb 3.2 oz (64.5 kg)  07/06/21 137 lb 9.6 oz (62.4 kg)   3. Immunizations/screenings/ancillary studies- pt refuses all vaccines, despite extensive teaching.  There is no immunization history on file for this patient. There are no preventive care reminders to display for this patient.   4. Skin cancer screening- advised regular sunscreen use. Denies worrisome, changing, or new skin lesions.  5. Smoking associated screening:  smoker - 1 cigar qd. 6. STD screening - N/A - same partner for 10 years.  Assessment and Plan    STD screening Sexually active male requested STD screening. Last screening a year ago, no symptoms reported. - Order HIV, STD tests including chlamydia, gonorrhea, and trichomonas. - Perform urine test for STD screening.  Adult Wellness Visit Routine wellness visit. Regular exercise, stable weight, cholesterol not checked this year due to wnl last year. - Order blood count and metabolic panel. - Order hepatitis C screening. - Order thyroid function test.  Tobacco use disorder Tobacco use with recent quit attempts using nicotine gum. Experienced irritability. Discussed Chantix for curbing nicotine cravings and hydroxyzine for anxiety triggers. Explained Chantix may cause queasiness or vivid dreams but effective for cravings. Hydroxyzine non-addictive for anxiety. - Consider Chantix for smoking cessation if he decides. - Consider hydroxyzine for anxiety-related smoking triggers. - Encourage continued use of nicotine gum if preferred. - Provide resources for smoking cessation support.     Recommended follow up: Return for any future concerns, Complete physical w/fasting labs. No future appointments.  Lab/Order associations: fasting   ICD-10-CM   1. Annual physical exam  Z00.00 Comprehensive metabolic  panel with GFR    CBC with Differential/Platelet    Hepatitis C Antibody    TSH    HIV antibody (with reflex)    2. Routine screening for STI (sexually transmitted infection)  Z11.3 Urine cytology ancillary only      Return precautions advised.  Lucius Krabbe, NP

## 2024-04-09 NOTE — Patient Instructions (Signed)
 It was very nice to see you today!   I will review your lab results via MyChart in a few days.  You look great! Stay well! Keep working on complete smoking cessation :-) Let me know if you want me to send in the generic Chantix to help.     PLEASE NOTE:  If you had any lab tests please let us  know if you have not heard back within a few days. You may see your results on MyChart before we have a chance to review them but we will give you a call once they are reviewed by us . If we ordered any referrals today, please let us  know if you have not heard from their office within the next week.

## 2024-04-10 ENCOUNTER — Ambulatory Visit: Payer: Self-pay | Admitting: Family

## 2024-04-10 LAB — HIV ANTIBODY (ROUTINE TESTING W REFLEX)
HIV 1&2 Ab, 4th Generation: NONREACTIVE
HIV FINAL INTERPRETATION: NEGATIVE

## 2024-04-10 LAB — HEPATITIS C ANTIBODY: Hepatitis C Ab: NONREACTIVE
